# Patient Record
Sex: Male | Born: 1960 | Race: White | Hispanic: No | Marital: Married | Smoking: Never smoker
Health system: Southern US, Community
[De-identification: ages and names within clinical notes are randomized; demographics above are authoritative.]

## PROBLEM LIST (undated history)

## (undated) DIAGNOSIS — G4730 Sleep apnea, unspecified: Secondary | ICD-10-CM

## (undated) DIAGNOSIS — T7840XA Allergy, unspecified, initial encounter: Secondary | ICD-10-CM

## (undated) DIAGNOSIS — K219 Gastro-esophageal reflux disease without esophagitis: Secondary | ICD-10-CM

## (undated) DIAGNOSIS — G5600 Carpal tunnel syndrome, unspecified upper limb: Secondary | ICD-10-CM

## (undated) DIAGNOSIS — N200 Calculus of kidney: Secondary | ICD-10-CM

## (undated) DIAGNOSIS — M1990 Unspecified osteoarthritis, unspecified site: Secondary | ICD-10-CM

## (undated) HISTORY — PX: HERNIA REPAIR: SHX51

## (undated) HISTORY — DX: Allergy, unspecified, initial encounter: T78.40XA

## (undated) HISTORY — PX: HAND TENOLYSIS: SHX978

## (undated) HISTORY — DX: Calculus of kidney: N20.0

## (undated) HISTORY — DX: Unspecified osteoarthritis, unspecified site: M19.90

## (undated) HISTORY — DX: Gastro-esophageal reflux disease without esophagitis: K21.9

## (undated) HISTORY — DX: Sleep apnea, unspecified: G47.30

## (undated) HISTORY — DX: Carpal tunnel syndrome, unspecified upper limb: G56.00

---

## 2006-01-14 ENCOUNTER — Inpatient Hospital Stay (HOSPITAL_COMMUNITY): Admission: AC | Admit: 2006-01-14 | Discharge: 2006-01-19 | Payer: Self-pay

## 2006-01-17 ENCOUNTER — Ambulatory Visit: Payer: Self-pay | Admitting: Physical Medicine & Rehabilitation

## 2007-04-23 IMAGING — CT CT EXTREM UP W/O CM*L*
2 series · 15 of 20 positions shown, 18 images · IV contrast (agent unspecified)
Comparison: none

CLINICAL DATA: Comminuted displaced impacted fracture of the distal left radius.  
 CT OF THE LEFT WRIST WITHOUT CONTRAST:
TECHNIQUE: Multidetector CT imaging was performed according to the standard protocol.  Sagittal and coronal reconstructions were performed for better delineation of the alignment and position of the multiple fracture fragments.

[Series 3: extremitywrist lf 1.0 (id) · axial · 0.48mm/px · z∈[+32,+200]mm · 12 of 201 slices shown, 15 images]
[im 16/201  soft-tissue]
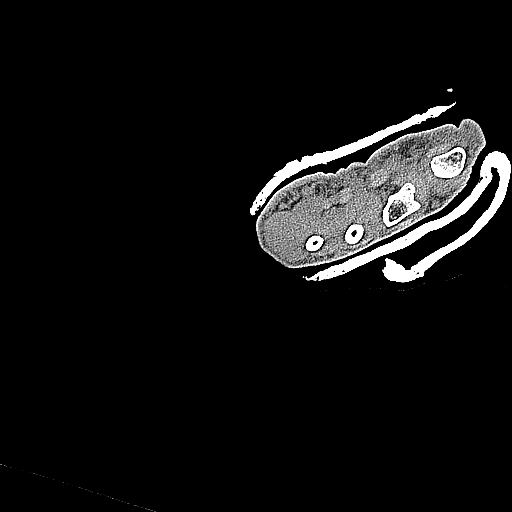
[im 16/201  bone]
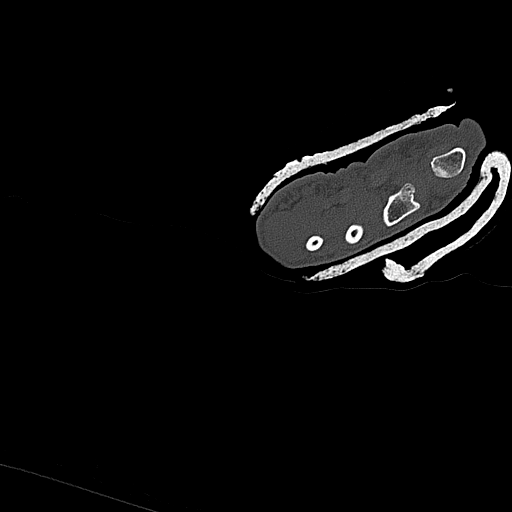
[im 31/201  bone]
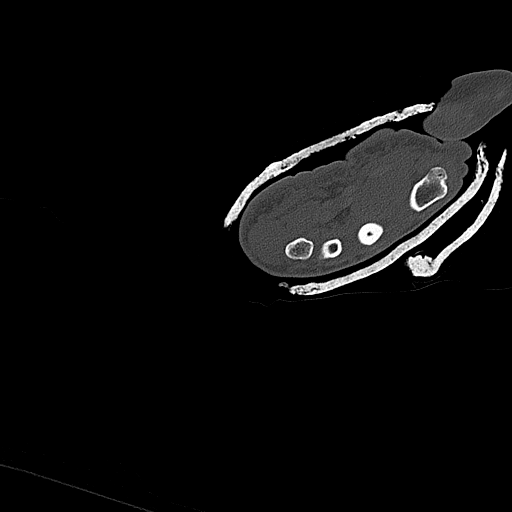
[im 47/201  bone]
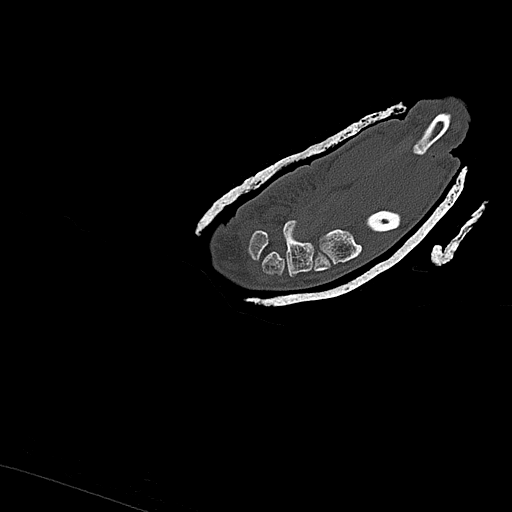
[im 62/201  bone]
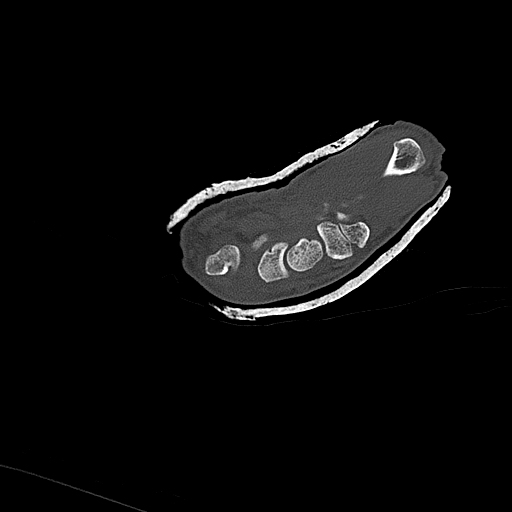
[im 77/201  soft-tissue]
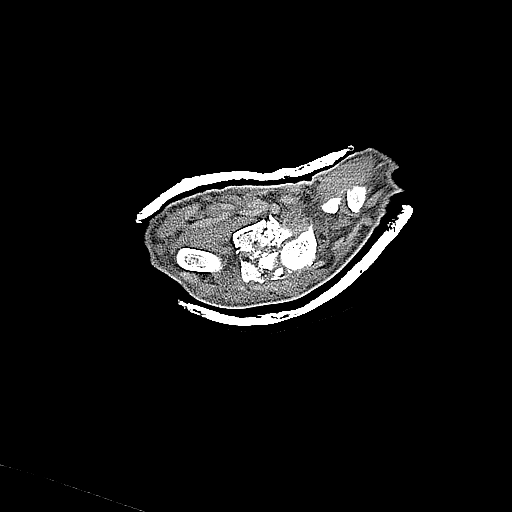
[im 77/201  bone]
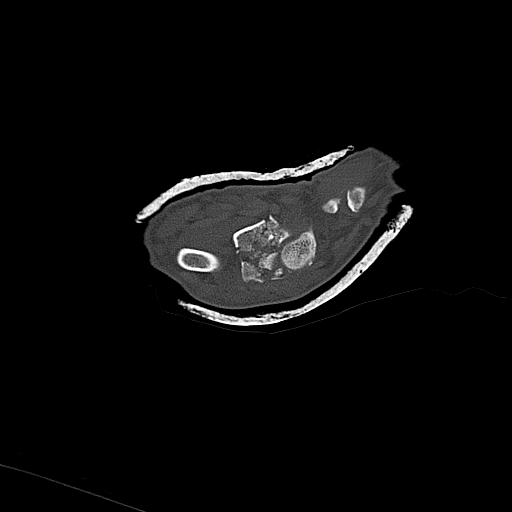
[im 93/201  bone]
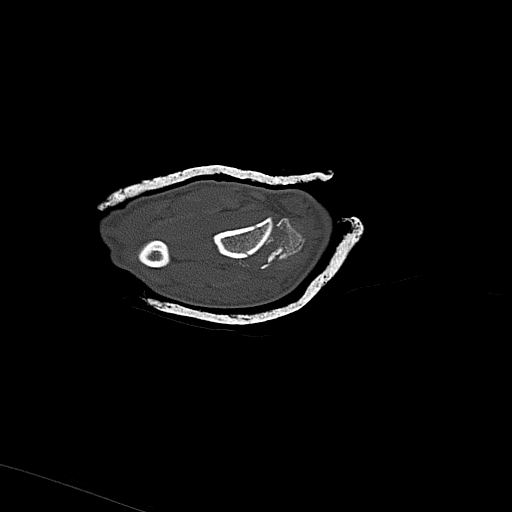
[im 108/201  bone]
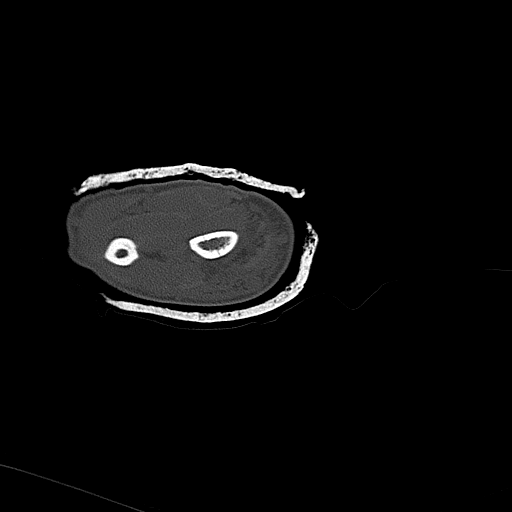
[im 124/201  bone]
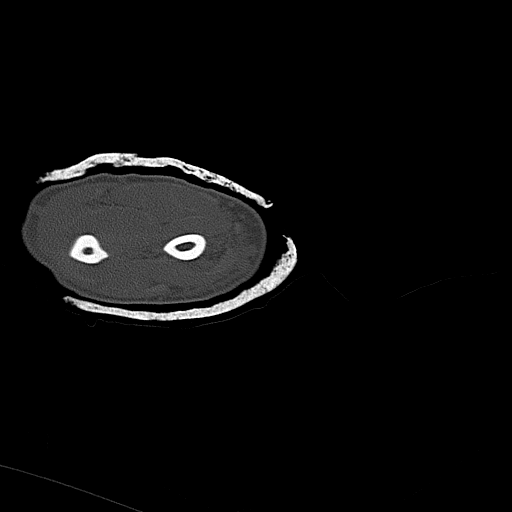
[im 139/201  soft-tissue]
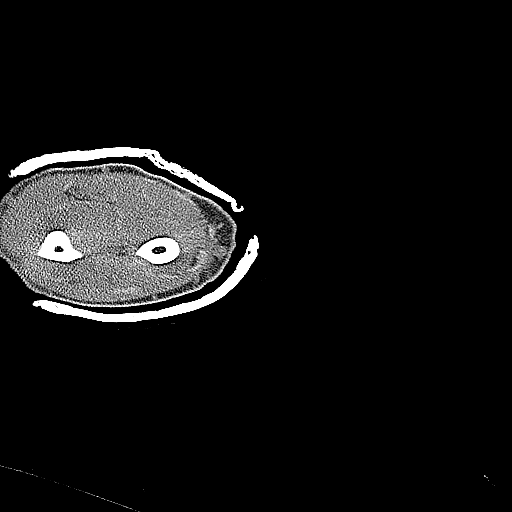
[im 139/201  bone]
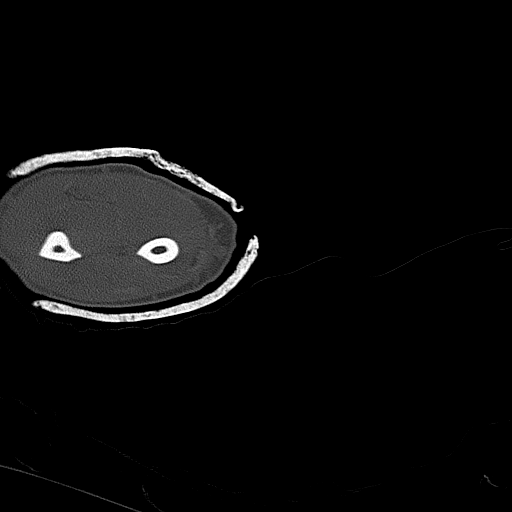
[im 154/201  bone]
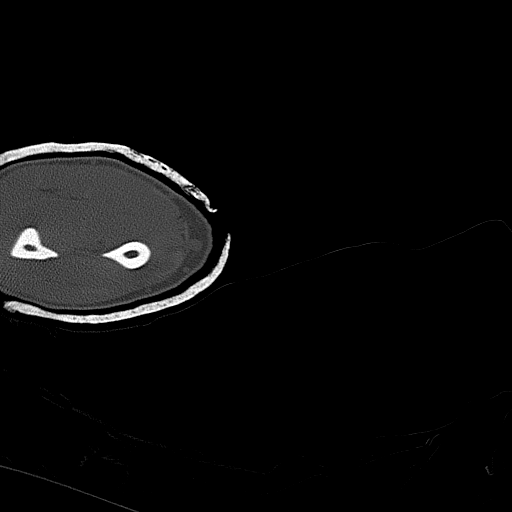
[im 170/201  bone]
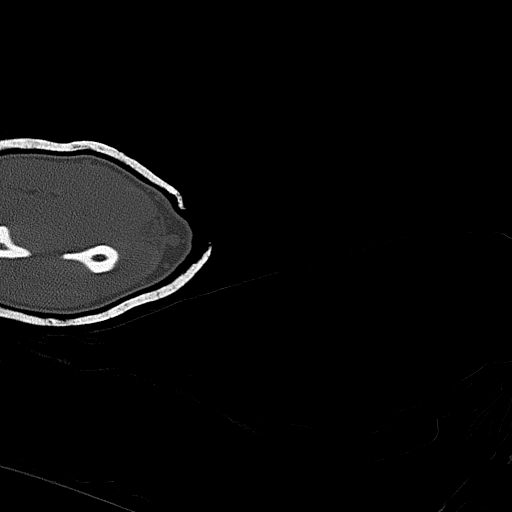
[im 185/201  bone]
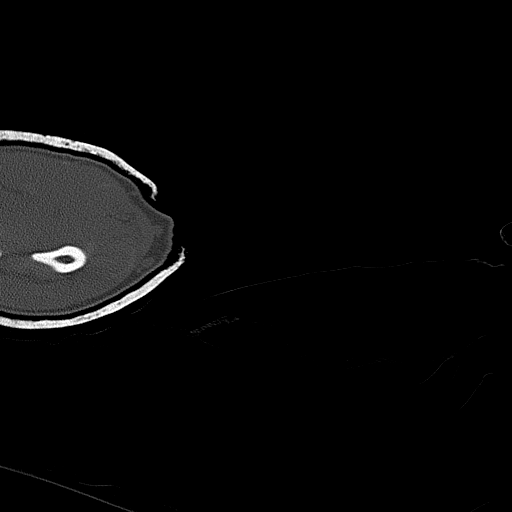

[Series 603: coronal left wrist · coronal · 0.48mm/px · 3 of 20 slices shown]
[im 4/20  bone]
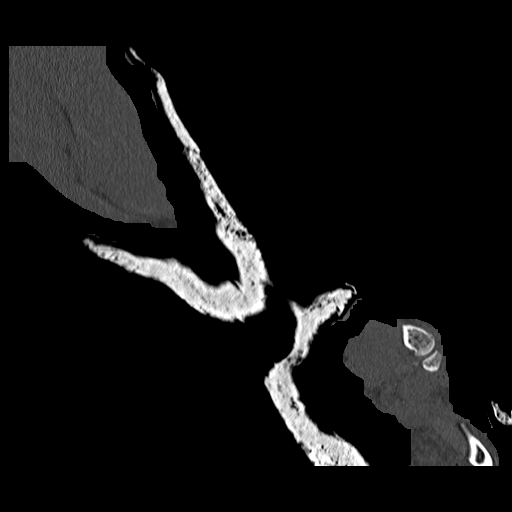
[im 8/20  bone]
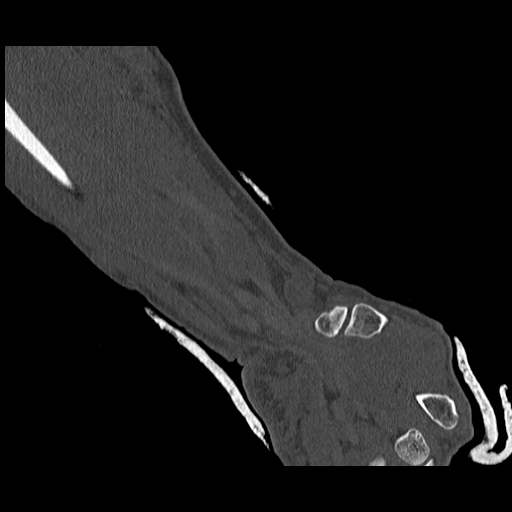
[im 12/20  bone]
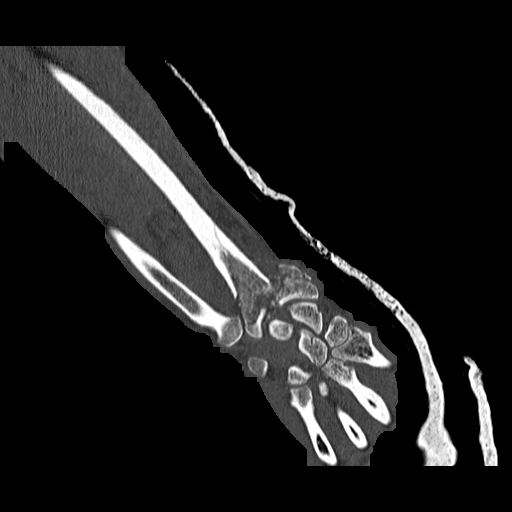

[15 of 20 positions shown; findings below may reference images not displayed]

FINDINGS: There is a severely comminuted impacted dorsally displaced fracture of the distal radius.  There is also a spiral fracture through the metadiaphyseal region of the distal radius but this fracture is nondisplaced.   The distal ulna articulates with fracture fragments and the distal radioulnar joint disease not appear widened.  Carpal bones appear normal in alignment and position.
IMPRESSION: Severely comminuted slightly impacted dorsally displaced fractures of the distal left radius.

## 2007-04-25 IMAGING — CR DG CHEST 1V PORT
1 series · 1 of 1 positions shown · non-contrast
Comparison: Yesterday?s exam.

CLINICAL DATA: Fell off roof.  Fractured ribs and left wrist. 
 PORTABLE CHEST - 1 VIEW, 01/16/06, 4794 HOURS:

[view not recorded]
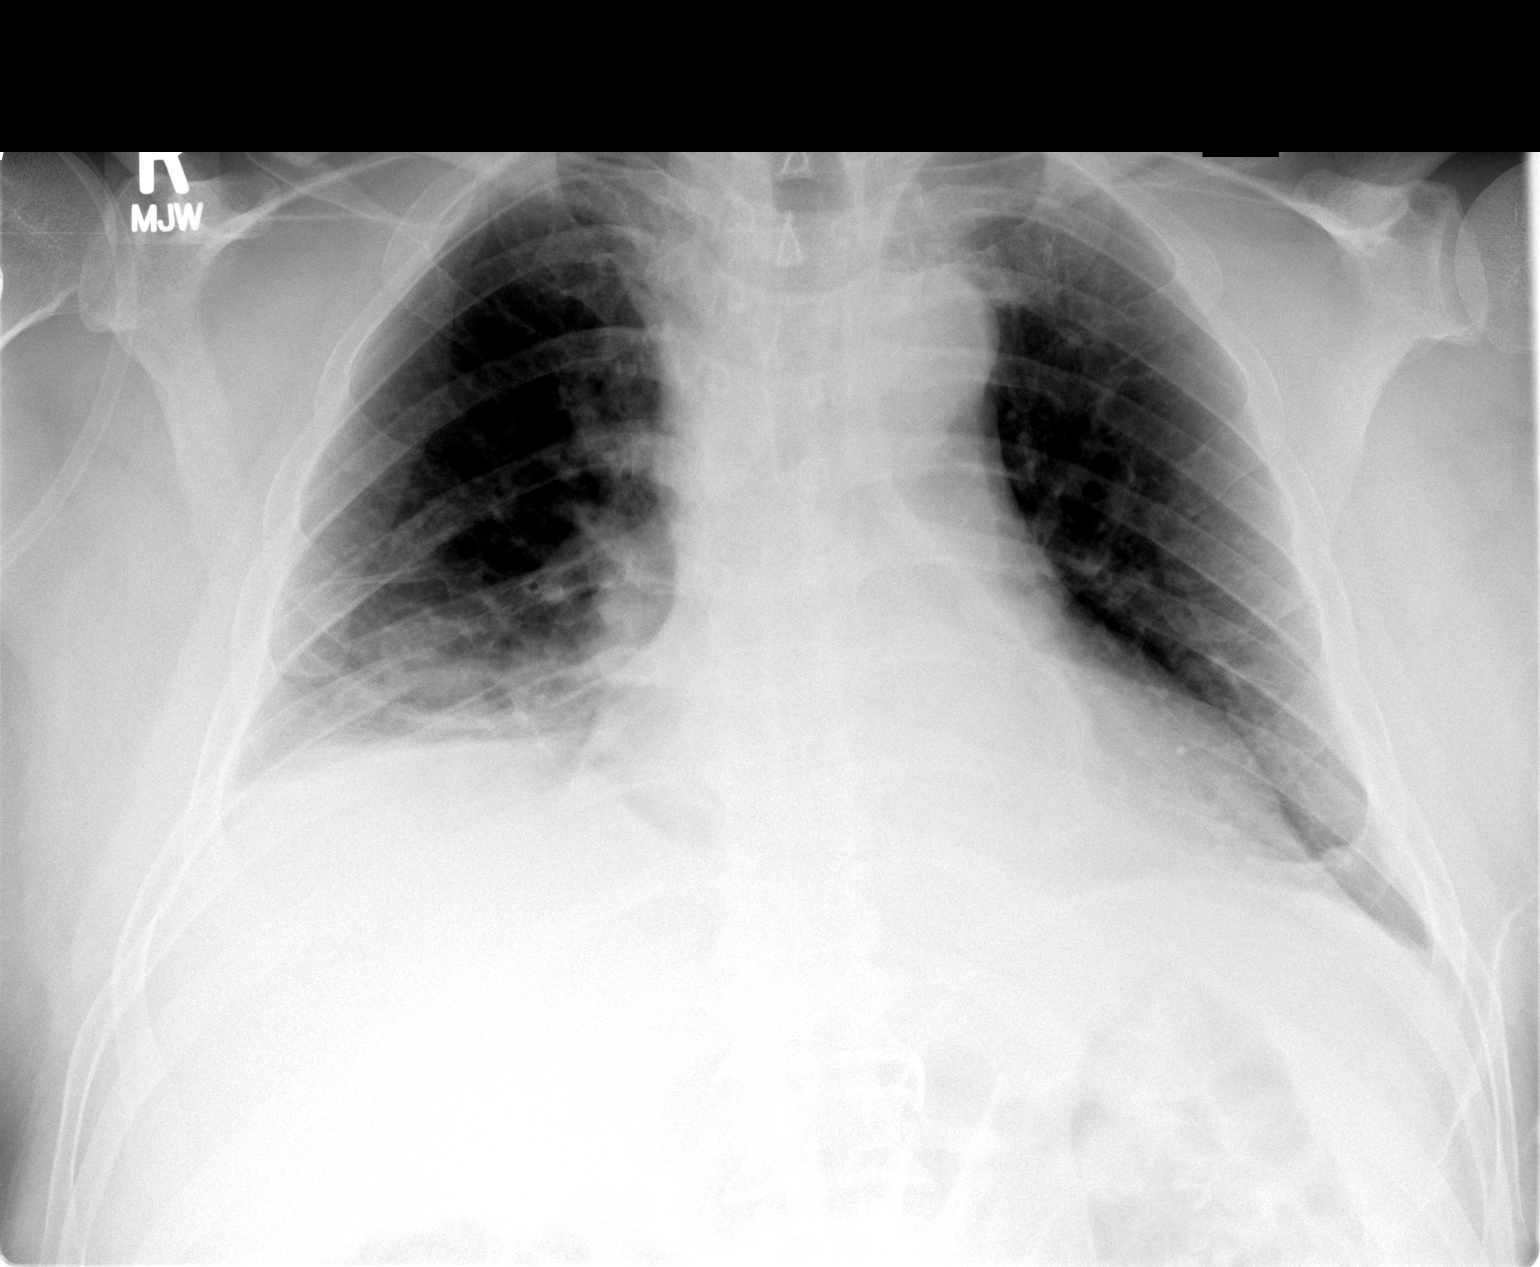

[1 of 1 positions shown; findings below may reference images not displayed]

FINDINGS: Subsegmental atelectasis at the lung bases is again noted.  No pneumothorax.
IMPRESSION: Bibasilar subsegmental atelectasis mainly at the right base again noted.  No pneumothorax.

## 2018-05-09 ENCOUNTER — Encounter: Payer: Self-pay | Admitting: *Deleted

## 2018-05-17 ENCOUNTER — Ambulatory Visit: Payer: Self-pay | Admitting: Family Medicine

## 2018-05-28 ENCOUNTER — Encounter: Payer: Self-pay | Admitting: Family Medicine

## 2018-05-28 ENCOUNTER — Ambulatory Visit: Payer: 59 | Admitting: Family Medicine

## 2018-05-28 VITALS — BP 156/89 | HR 75 | Temp 97.0°F | Ht 72.0 in | Wt 320.8 lb

## 2018-05-28 DIAGNOSIS — Z0000 Encounter for general adult medical examination without abnormal findings: Secondary | ICD-10-CM | POA: Diagnosis not present

## 2018-05-28 LAB — URINALYSIS
BILIRUBIN UA: NEGATIVE
Glucose, UA: NEGATIVE
Ketones, UA: NEGATIVE
Nitrite, UA: NEGATIVE
PH UA: 7 (ref 5.0–7.5)
Protein, UA: NEGATIVE
Specific Gravity, UA: 1.02 (ref 1.005–1.030)
UUROB: 1 mg/dL (ref 0.2–1.0)

## 2018-05-28 LAB — BAYER DCA HB A1C WAIVED: HB A1C: 5.3 % (ref <7.0)

## 2018-05-28 MED ORDER — TAMSULOSIN HCL 0.4 MG PO CAPS: 0.8000 mg | ORAL_CAPSULE | Freq: Every day | ORAL | 3 refills | Status: DC

## 2018-05-28 MED ORDER — SILDENAFIL CITRATE 20 MG PO TABS: 20.0000 mg | ORAL_TABLET | Freq: Every day | ORAL | 5 refills | Status: DC | PRN

## 2018-05-28 NOTE — Progress Notes (Signed)
Subjective:  Patient ID: Jimmy Levine, male    DOB: 02-01-1961  Age: 57 y.o. MRN: [id number]  CC: New Patient (Initial Visit)   HPI Jimmy Levine presents for decreased urine stream. Also notes that he is having to get up multiple times at night to urinate. Additionally he is having erectile dysfunction. Due for CPE.  History Jimmy Levine has a past medical history of Allergy, Arthritis, Carpal tunnel syndrome, GERD (gastroesophageal reflux disease), Kidney stone, and Sleep apnea.   He has a past surgical history that includes Hernia repair and Hand Tenolysis.   His family history includes Arthritis in his brother, father, and mother; COPD in his brother and mother; Heart disease in his father; Hypertension in his mother.He reports that he has never smoked. He does not have any smokeless tobacco history on file. He reports that he drank alcohol. He reports that he does not use drugs.  Current Outpatient Medications on File Prior to Visit  Medication Sig Dispense Refill  . naproxen sodium (ALEVE) 220 MG tablet Take 220 mg by mouth daily as needed.     No current facility-administered medications on file prior to visit.     ROS Review of Systems  Constitutional: Negative for activity change, fatigue and unexpected weight change.  HENT: Negative for congestion, ear pain, hearing loss, postnasal drip and trouble swallowing.   Eyes: Negative for pain and visual disturbance.  Respiratory: Negative for cough, chest tightness and shortness of breath.   Cardiovascular: Negative for chest pain, palpitations and leg swelling.  Gastrointestinal: Negative for abdominal distention, abdominal pain, blood in stool, constipation, diarrhea, nausea and vomiting.  Endocrine: Negative for cold intolerance, heat intolerance and polydipsia.  Genitourinary: Negative for difficulty urinating, dysuria, flank pain, frequency and urgency.  Musculoskeletal: Negative for arthralgias and joint swelling.    Skin: Negative for color change, rash and wound.  Neurological: Negative for dizziness, syncope, speech difficulty, weakness, light-headedness, numbness and headaches.  Hematological: Does not bruise/bleed easily.  Psychiatric/Behavioral: Negative for confusion, decreased concentration, dysphoric mood and sleep disturbance. The patient is not nervous/anxious.     Objective:  BP (!) 156/89   Pulse 75   Temp (!) 97 F (36.1 C)   Ht 6' (1.829 m)   Wt (!) 320 lb 12.8 oz (145.5 kg)   BMI 43.51 kg/m   Physical Exam  Constitutional: He is oriented to person, place, and time. He appears well-developed and well-nourished.  HENT:  Head: Normocephalic and atraumatic.  Mouth/Throat: Oropharynx is clear and moist.  Eyes: Pupils are equal, round, and reactive to light. EOM are normal.  Neck: Normal range of motion. No tracheal deviation present. No thyromegaly present.  Cardiovascular: Normal rate, regular rhythm and normal heart sounds. Exam reveals no gallop and no friction rub.  No murmur heard. Pulmonary/Chest: Breath sounds normal. He has no wheezes. He has no rales.  Abdominal: Soft. Bowel sounds are normal. He exhibits no distension and no mass. There is no tenderness. Hernia confirmed negative in the right inguinal area and confirmed negative in the left inguinal area.  Genitourinary: Testes normal and penis normal.  Musculoskeletal: Normal range of motion. He exhibits no edema.  Lymphadenopathy:    He has no cervical adenopathy.  Neurological: He is alert and oriented to person, place, and time.  Skin: Skin is warm and dry.  Psychiatric: He has a normal mood and affect.    Assessment & Plan:   Jimmy Levine was seen today for new patient (initial visit).  Diagnoses  and all orders for this visit:  Annual physical exam -     CBC with Differential/Platelet -     CMP14+EGFR -     Lipid panel -     PSA, total and free -     Testosterone,Free and Total -     Urinalysis -     Bayer  DCA Hb A1c Waived  Other orders -     sildenafil (REVATIO) 20 MG tablet; Take 1 tablet (20 mg total) by mouth daily as needed (2-5 as needed). -     tamsulosin (FLOMAX) 0.4 MG CAPS capsule; Take 2 capsules (0.8 mg total) by mouth at bedtime. For urine flow and prostate   I am having Jimmy Levine "Jimmy Levine" start on sildenafil and tamsulosin. I am also having him maintain his naproxen sodium.  Meds ordered this encounter  Medications  . sildenafil (REVATIO) 20 MG tablet    Sig: Take 1 tablet (20 mg total) by mouth daily as needed (2-5 as needed).    Dispense:  50 tablet    Refill:  5  . tamsulosin (FLOMAX) 0.4 MG CAPS capsule    Sig: Take 2 capsules (0.8 mg total) by mouth at bedtime. For urine flow and prostate    Dispense:  180 capsule    Refill:  3     Follow-up: Return in about 6 months (around 11/26/2018).  Claretta Fraise, M.D.

## 2018-05-29 LAB — CMP14+EGFR
ALT: 33 IU/L (ref 0–44)
AST: 21 IU/L (ref 0–40)
Albumin/Globulin Ratio: 1.8 (ref 1.2–2.2)
Albumin: 4.2 g/dL (ref 3.5–5.5)
Alkaline Phosphatase: 67 IU/L (ref 39–117)
BUN/Creatinine Ratio: 13 (ref 9–20)
BUN: 12 mg/dL (ref 6–24)
Bilirubin Total: 0.5 mg/dL (ref 0.0–1.2)
CO2: 25 mmol/L (ref 20–29)
Calcium: 9 mg/dL (ref 8.7–10.2)
Chloride: 100 mmol/L (ref 96–106)
Creatinine, Ser: 0.91 mg/dL (ref 0.76–1.27)
GFR calc Af Amer: 107 mL/min/1.73 (ref >59)
GFR calc non Af Amer: 93 mL/min/1.73 (ref >59)
Globulin, Total: 2.4 g/dL (ref 1.5–4.5)
Glucose: 78 mg/dL (ref 65–99)
Potassium: 4.3 mmol/L (ref 3.5–5.2)
Sodium: 143 mmol/L (ref 134–144)
Total Protein: 6.6 g/dL (ref 6.0–8.5)

## 2018-05-29 LAB — LIPID PANEL
Chol/HDL Ratio: 4.3 ratio (ref 0.0–5.0)
Cholesterol, Total: 169 mg/dL (ref 100–199)
HDL: 39 mg/dL — ABNORMAL LOW (ref >39)
LDL Calculated: 104 mg/dL — ABNORMAL HIGH (ref 0–99)
Triglycerides: 128 mg/dL (ref 0–149)
VLDL Cholesterol Cal: 26 mg/dL (ref 5–40)

## 2018-05-29 LAB — CBC WITH DIFFERENTIAL/PLATELET
Basophils Absolute: 0.1 x10E3/uL (ref 0.0–0.2)
Basos: 1 %
EOS (ABSOLUTE): 0.1 x10E3/uL (ref 0.0–0.4)
Eos: 2 %
Hematocrit: 47.9 % (ref 37.5–51.0)
Hemoglobin: 16.2 g/dL (ref 13.0–17.7)
Immature Grans (Abs): 0 x10E3/uL (ref 0.0–0.1)
Immature Granulocytes: 1 %
Lymphocytes Absolute: 1.4 x10E3/uL (ref 0.7–3.1)
Lymphs: 24 %
MCH: 30.6 pg (ref 26.6–33.0)
MCHC: 33.8 g/dL (ref 31.5–35.7)
MCV: 90 fL (ref 79–97)
Monocytes Absolute: 0.8 x10E3/uL (ref 0.1–0.9)
Monocytes: 13 %
Neutrophils Absolute: 3.5 x10E3/uL (ref 1.4–7.0)
Neutrophils: 59 %
Platelets: 216 x10E3/uL (ref 150–450)
RBC: 5.3 x10E6/uL (ref 4.14–5.80)
RDW: 12.7 % (ref 12.3–15.4)
WBC: 5.9 x10E3/uL (ref 3.4–10.8)

## 2018-05-29 LAB — PSA, TOTAL AND FREE
PSA FREE PCT: 28 %
PSA, Free: 0.42 ng/mL
Prostate Specific Ag, Serum: 1.5 ng/mL (ref 0.0–4.0)

## 2018-05-29 LAB — TESTOSTERONE,FREE AND TOTAL
TESTOSTERONE FREE: 11.6 pg/mL (ref 7.2–24.0)
Testosterone: 297 ng/dL (ref 264–916)

## 2018-05-29 MED ORDER — SILDENAFIL CITRATE 20 MG PO TABS: 20.0000 mg | ORAL_TABLET | Freq: Every day | ORAL | 5 refills | Status: DC | PRN

## 2018-05-29 MED ORDER — TAMSULOSIN HCL 0.4 MG PO CAPS: 0.8000 mg | ORAL_CAPSULE | Freq: Every day | ORAL | 3 refills | Status: DC

## 2018-05-29 NOTE — Progress Notes (Signed)
Hello [place],  Your lab result is normal.Some minor variations that are not significant are commonly marked abnormal, but do not represent any medical problem for you.  Best regards, Abdelaziz Westenberger, M.D.

## 2018-05-29 NOTE — Addendum Note (Signed)
Addended byDory Peru: Terik Haughey C on: 05/29/2018 08:46 AM   Modules accepted: Orders

## 2018-06-13 ENCOUNTER — Ambulatory Visit: Payer: 59 | Admitting: Nurse Practitioner

## 2018-06-13 ENCOUNTER — Encounter: Payer: Self-pay | Admitting: Nurse Practitioner

## 2018-06-13 VITALS — BP 162/97 | HR 88 | Temp 97.6°F | Ht 72.0 in | Wt 322.0 lb

## 2018-06-13 DIAGNOSIS — R509 Fever, unspecified: Secondary | ICD-10-CM

## 2018-06-13 DIAGNOSIS — R05 Cough: Secondary | ICD-10-CM

## 2018-06-13 DIAGNOSIS — J40 Bronchitis, not specified as acute or chronic: Secondary | ICD-10-CM | POA: Diagnosis not present

## 2018-06-13 DIAGNOSIS — R059 Cough, unspecified: Secondary | ICD-10-CM

## 2018-06-13 MED ORDER — BENZONATATE 100 MG PO CAPS: 100.0000 mg | ORAL_CAPSULE | Freq: Three times a day (TID) | ORAL | 0 refills | Status: DC | PRN

## 2018-06-13 MED ORDER — BUDESONIDE-FORMOTEROL FUMARATE 80-4.5 MCG/ACT IN AERO: 2.0000 | INHALATION_SPRAY | Freq: Two times a day (BID) | RESPIRATORY_TRACT | 3 refills | Status: DC

## 2018-06-13 MED ORDER — PREDNISONE 20 MG PO TABS
ORAL_TABLET | ORAL | 0 refills | Status: DC
Start: 2018-06-13 — End: 2018-07-19

## 2018-06-13 MED ORDER — LEVALBUTEROL HCL 1.25 MG/3ML IN NEBU
1.2500 mg | INHALATION_SOLUTION | RESPIRATORY_TRACT | Status: AC
  Administered 2018-06-13: 1.25 mg via RESPIRATORY_TRACT

## 2018-06-13 MED ORDER — AZITHROMYCIN 250 MG PO TABS
ORAL_TABLET | ORAL | 0 refills | Status: DC
Start: 2018-06-13 — End: 2018-07-19

## 2018-06-13 NOTE — Progress Notes (Signed)
Subjective:    Patient ID: Jimmy Levine, male    DOB: Jan 23, 1961, 57 y.o.   MRN: [id number]   Chief Complaint: Cough; Nasal Congestion; and Fever   HPI Patient comes in c/o cough, congestion and fever. Started late Monday evening. He has not checked temperature but has fellt hot and had some chills. Has been using delsym OTC for cough- no help.   Review of Systems  Constitutional: Positive for chills and fatigue.  HENT: Positive for congestion, rhinorrhea, sinus pain and sore throat. Negative for ear pain and trouble swallowing.   Respiratory: Positive for cough (productive).   Gastrointestinal: Negative.   Musculoskeletal: Negative.   Neurological: Positive for headaches.  Psychiatric/Behavioral: Negative.   All other systems reviewed and are negative.      Objective:   Physical Exam Vitals signs and nursing note reviewed.  Constitutional:      Appearance: Normal appearance. He is obese. He is ill-appearing.  HENT:     Right Ear: Tympanic membrane, ear canal and external ear normal.     Left Ear: Tympanic membrane, ear canal and external ear normal.     Nose: Nose normal.     Mouth/Throat:     Mouth: Mucous membranes are moist.  Eyes:     Pupils: Pupils are equal, round, and reactive to light.  Neck:     Musculoskeletal: Normal range of motion and neck supple. No muscular tenderness.  Cardiovascular:     Rate and Rhythm: Normal rate and regular rhythm.     Pulses: Normal pulses.     Heart sounds: Normal heart sounds.  Pulmonary:     Breath sounds: Wheezing (tight course exp wheezes) present.  Skin:    General: Skin is warm and dry.  Neurological:     General: No focal deficit present.     Mental Status: He is alert and oriented to person, place, and time.  Psychiatric:        Mood and Affect: Mood normal.        Behavior: Behavior normal.        Thought Content: Thought content normal.   BP (!) 162/97   Pulse 88   Temp 97.6 F (36.4 C) (Oral)   Ht  6' (1.829 m)   Wt (!) 322 lb (146.1 kg)   BMI 43.67 kg/m   S/P xopenex breathing treatment- looser exp wheezes throughout       Assessment & Plan:  Jimmy Levine in today with chief complaint of Cough; Nasal Congestion; and Fever   1. Fever, unspecified fever cause - Veritor Flu A/B Waived  2. Cough  - levalbuterol (XOPENEX) nebulizer solution 1.25 mg  3. Bronchitis 1. Take meds as prescribed 2. Use a cool mist humidifier especially during the winter months and when heat has been humid. 3. Use saline nose sprays frequently 4. Saline irrigations of the nose can be very helpful if done frequently.  * 4X daily for 1 week*  * Use of a nettie pot can be helpful with this. Follow directions with this* 5. Drink plenty of fluids 6. Keep thermostat turn down low 7.For any cough or congestion  Use plain Mucinex- regular strength or max strength is fine   * Children- consult with Pharmacist for dosing 8. For fever or aces or pains- take tylenol or ibuprofen appropriate for age and weight.  * for fevers greater than 101 orally you may alternate ibuprofen and tylenol every  3 hours.    - azithromycin (  ZITHROMAX Z-PAK) 250 MG tablet; As directed  Dispense: 6 tablet; Refill: 0 - predniSONE (DELTASONE) 20 MG tablet; 2 po at sametime daily for 5 days  Dispense: 10 tablet; Refill: 0  Mary-Margaret Daphine Deutscher, FNP

## 2018-06-13 NOTE — Patient Instructions (Signed)

## 2018-06-14 LAB — VERITOR FLU A/B WAIVED
INFLUENZA B: NEGATIVE
Influenza A: NEGATIVE

## 2018-07-19 ENCOUNTER — Ambulatory Visit: Payer: 59 | Admitting: Nurse Practitioner

## 2018-07-19 ENCOUNTER — Ambulatory Visit (INDEPENDENT_AMBULATORY_CARE_PROVIDER_SITE_OTHER): Payer: 59

## 2018-07-19 ENCOUNTER — Encounter: Payer: Self-pay | Admitting: Nurse Practitioner

## 2018-07-19 VITALS — BP 149/97 | HR 81 | Temp 97.7°F | Ht 72.0 in | Wt 319.0 lb

## 2018-07-19 DIAGNOSIS — M1711 Unilateral primary osteoarthritis, right knee: Secondary | ICD-10-CM | POA: Diagnosis not present

## 2018-07-19 DIAGNOSIS — M25561 Pain in right knee: Secondary | ICD-10-CM

## 2018-07-19 MED ORDER — PREDNISONE 10 MG (21) PO TBPK: ORAL_TABLET | ORAL | 0 refills | Status: DC

## 2018-07-19 NOTE — Progress Notes (Signed)
   Subjective:    Patient ID: Jimmy Levine, male    DOB: Apr 22, 1961, 58 y.o.   MRN: [id number]   Chief Complaint:  Right knee pain   HPI Patient comes in today c/o right knee pain. This started 2-3 days ago and swelling started on Tuesday. 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  Rates pain 4-8 . External rotation and flxion at sametime causes the most pain.   Review of Systems  Respiratory: Negative.   Cardiovascular: Negative.   Musculoskeletal: Positive for arthralgias.  All other systems reviewed and are negative.      Objective:   Physical Exam Vitals signs and nursing note reviewed.  Constitutional:      General: He is in acute distress (mild).     Appearance: He is obese.  Cardiovascular:     Rate and Rhythm: Normal rate and regular rhythm.     Pulses: Normal pulses.     Heart sounds: Normal heart sounds.  Musculoskeletal:     Comments: Mild right knee effusion crepiutus on flexion and extension Pain on external rotation and flexion at same time.  Skin:    General: Skin is warm.  Neurological:     General: No focal deficit present.     Mental Status: He is alert and oriented to person, place, and time.  Psychiatric:        Mood and Affect: Mood normal.        Behavior: Behavior normal.    BP (!) 149/97   Pulse 81   Temp 97.7 F (36.5 C) (Oral)   Ht 6' (1.829 m)   Wt (!) 319 lb (144.7 kg)   BMI 43.26 kg/m     Right knee xray- mild osteoarthritis of distal femoral condyle    Assessment & Plan:  Jimmy Levine in today with chief complaint of Knee Pain (right)   1. Acute pain of right knee Elastic wrap   ice bid - DG Knee 1-2 Views Right; Future - predniSONE (STERAPRED UNI-PAK 21 TAB) 10 MG (21) TBPK tablet; As directed x 6 days  Dispense: 21 tablet; Refill: 0  RTO prn  Mary-Margaret Daphine Deutscher, FNP

## 2018-07-19 NOTE — Patient Instructions (Signed)

## 2018-11-29 ENCOUNTER — Encounter: Payer: Self-pay | Admitting: Family Medicine

## 2018-11-29 ENCOUNTER — Ambulatory Visit: Payer: 59 | Admitting: Family Medicine

## 2018-11-29 ENCOUNTER — Other Ambulatory Visit: Payer: Self-pay

## 2018-11-29 NOTE — Progress Notes (Signed)
No answer 3:35, 3:44 Mechele Claude, MD

## 2019-03-26 ENCOUNTER — Encounter: Payer: Self-pay | Admitting: Family

## 2019-03-26 ENCOUNTER — Ambulatory Visit: Payer: 59 | Admitting: Family

## 2019-03-26 ENCOUNTER — Other Ambulatory Visit: Payer: Self-pay

## 2019-03-26 VITALS — BP 165/104 | HR 98 | Temp 98.0°F | Resp 20 | Ht 71.0 in | Wt 320.0 lb

## 2019-03-26 DIAGNOSIS — R0683 Snoring: Secondary | ICD-10-CM

## 2019-03-26 DIAGNOSIS — R5383 Other fatigue: Secondary | ICD-10-CM | POA: Diagnosis not present

## 2019-03-26 DIAGNOSIS — Z0001 Encounter for general adult medical examination with abnormal findings: Secondary | ICD-10-CM | POA: Diagnosis not present

## 2019-03-26 DIAGNOSIS — Z1211 Encounter for screening for malignant neoplasm of colon: Secondary | ICD-10-CM

## 2019-03-26 DIAGNOSIS — Z1212 Encounter for screening for malignant neoplasm of rectum: Secondary | ICD-10-CM

## 2019-03-26 DIAGNOSIS — M25561 Pain in right knee: Secondary | ICD-10-CM

## 2019-03-26 DIAGNOSIS — Z1159 Encounter for screening for other viral diseases: Secondary | ICD-10-CM

## 2019-03-26 DIAGNOSIS — I10 Essential (primary) hypertension: Secondary | ICD-10-CM | POA: Diagnosis not present

## 2019-03-26 DIAGNOSIS — Z0000 Encounter for general adult medical examination without abnormal findings: Secondary | ICD-10-CM

## 2019-03-26 DIAGNOSIS — Z8249 Family history of ischemic heart disease and other diseases of the circulatory system: Secondary | ICD-10-CM

## 2019-03-26 DIAGNOSIS — Z114 Encounter for screening for human immunodeficiency virus [HIV]: Secondary | ICD-10-CM

## 2019-03-26 MED ORDER — LISINOPRIL-HYDROCHLOROTHIAZIDE 20-12.5 MG PO TABS: 1.0000 | ORAL_TABLET | Freq: Every day | ORAL | 3 refills | Status: DC

## 2019-03-26 MED ORDER — PREDNISONE 10 MG (21) PO TBPK: ORAL_TABLET | ORAL | 0 refills | Status: DC

## 2019-03-26 MED ORDER — DICLOFENAC SODIUM 75 MG PO TBEC: 75.0000 mg | DELAYED_RELEASE_TABLET | Freq: Two times a day (BID) | ORAL | 0 refills | Status: DC

## 2019-03-26 NOTE — Progress Notes (Signed)
Subjective:    Patient ID: Jimmy Levine, male    DOB: 05/12/1961, 58 y.o.   MRN: [id number]  Chief Complaint  Patient presents with  . Knee Pain   Pt presents to the office today for CPE and  right knee pain that started several weeks ago. He reports the pain is significant.  He does report fatigue that started over a year ago, but has worsen. He is morbid obese and does admit to snoring. He has lab work on 05/2018 that was stable. He does report his father had a stent placed when he was around 59  Year old.   Knee Pain  The incident occurred more than 1 week ago. There was no injury mechanism. The pain is present in the right knee. The pain is at a severity of 9/10. The pain is moderate. The pain has been constant since onset. Associated symptoms include a loss of motion. Pertinent negatives include no numbness or tingling. The symptoms are aggravated by movement and weight bearing. He has tried rest and acetaminophen for the symptoms. The treatment provided mild relief.  Hypertension This is a new problem. The current episode started more than 1 year ago. The problem has been waxing and waning since onset. The problem is uncontrolled. Associated symptoms include peripheral edema ("at the end of the day"). Pertinent negatives include no headaches, malaise/fatigue or shortness of breath. Risk factors for coronary artery disease include obesity. Past treatments include nothing. The current treatment provides no improvement. There is no history of kidney disease, CAD/MI, CVA or heart failure.      Review of Systems  Constitutional: Negative for malaise/fatigue.  Respiratory: Negative for shortness of breath.   Neurological: Negative for tingling, numbness and headaches.  All other systems reviewed and are negative.  Family History  Problem Relation Age of Onset  . Hypertension Mother   . COPD Mother   . Arthritis Mother   . Heart disease Father   . Arthritis Father   . COPD  Brother   . Arthritis Brother     Social History   Socioeconomic History  . Marital status: Married    Spouse name: Not on file  . Number of children: Not on file  . Years of education: Not on file  . Highest education level: Not on file  Occupational History  . Occupation: builder/welder  Social Needs  . Financial resource strain: Not on file  . Food insecurity    Worry: Not on file    Inability: Not on file  . Transportation needs    Medical: Not on file    Non-medical: Not on file  Tobacco Use  . Smoking status: Never Smoker  . Smokeless tobacco: Never Used  Substance and Sexual Activity  . Alcohol use: Not Currently    Frequency: Never    Comment: 10 yrs ago  . Drug use: Never  . Sexual activity: Yes    Birth control/protection: None  Lifestyle  . Physical activity    Days per week: Not on file    Minutes per session: Not on file  . Stress: Only a little  Relationships  . Social Herbalist on phone: Not on file    Gets together: Not on file    Attends religious service: Not on file    Active member of club or organization: Not on file    Attends meetings of clubs or organizations: Not on file    Relationship status: Not on  file  Other Topics Concern  . Not on file  Social History Narrative  . Not on file       Objective:   Physical Exam Vitals signs reviewed.  Constitutional:      General: He is not in acute distress.    Appearance: He is well-developed. He is obese.  HENT:     Head: Normocephalic.     Right Ear: Tympanic membrane normal.     Left Ear: Tympanic membrane normal.  Eyes:     General:        Right eye: No discharge.        Left eye: No discharge.     Pupils: Pupils are equal, round, and reactive to light.  Neck:     Musculoskeletal: Normal range of motion and neck supple.     Thyroid: No thyromegaly.  Cardiovascular:     Rate and Rhythm: Normal rate and regular rhythm.     Heart sounds: Normal heart sounds. No murmur.   Pulmonary:     Effort: Pulmonary effort is normal. No respiratory distress.     Breath sounds: Normal breath sounds. No wheezing.  Abdominal:     General: Bowel sounds are normal. There is no distension.     Palpations: Abdomen is soft.     Tenderness: There is no abdominal tenderness.  Musculoskeletal: Normal range of motion.        General: Tenderness present.     Comments: Pain in right medial knee with flexion and extension  Skin:    General: Skin is warm and dry.     Findings: No erythema or rash.  Neurological:     Mental Status: He is alert and oriented to person, place, and time.     Cranial Nerves: No cranial nerve deficit.     Deep Tendon Reflexes: Reflexes are normal and symmetric.  Psychiatric:        Behavior: Behavior normal.        Thought Content: Thought content normal.        Judgment: Judgment normal.     BP (!) 165/104   Pulse 98   Temp 98 F (36.7 C) (Temporal)   Resp 20   Ht '5\' 11"'$  (1.803 m)   Wt (!) 320 lb (145.2 kg)   SpO2 94%   BMI 44.63 kg/m      Assessment & Plan:  Jimmy Levine comes in today with chief complaint of Knee Pain   Diagnosis and orders addressed:  1. Acute pain of right knee - diclofenac (VOLTAREN) 75 MG EC tablet; Take 1 tablet (75 mg total) by mouth 2 (two) times daily.  Dispense: 30 tablet; Refill: 0 - Ambulatory referral to Orthopedic Surgery - CMP14+EGFR - CBC with Differential/Platelet - predniSONE (STERAPRED UNI-PAK 21 TAB) 10 MG (21) TBPK tablet; Use as directed  Dispense: 21 tablet; Refill: 0  2. Morbid obesity ([place]) - Ambulatory referral to Pulmonology - Ambulatory referral to Cardiology - CMP14+EGFR - CBC with Differential/Platelet - EKG 12-Lead  3. Essential hypertension - Ambulatory referral to Cardiology - lisinopril-hydrochlorothiazide (ZESTORETIC) 20-12.5 MG tablet; Take 1 tablet by mouth daily.  Dispense: 90 tablet; Refill: 3 - CMP14+EGFR - CBC with Differential/Platelet  4. Fatigue,  unspecified type - Ambulatory referral to Pulmonology - Ambulatory referral to Cardiology - CMP14+EGFR - CBC with Differential/Platelet - EKG 12-Lead  5. Snoring - Ambulatory referral to Pulmonology - CMP14+EGFR - CBC with Differential/Platelet  6. Family history of cardiovascular disease - Ambulatory referral to Cardiology -  CMP14+EGFR - CBC with Differential/Platelet  7. Colon cancer screening - Cologuard - CMP14+EGFR - CBC with Differential/Platelet  8. Screening for malignant neoplasm of the rectum - Cologuard - CMP14+EGFR - CBC with Differential/Platelet  9. Annual physical exam - CMP14+EGFR - CBC with Differential/Platelet - Lipid panel - TSH - PSA, total and free - Hepatitis C antibody - HIV Antibody (routine testing w rflx)  10. Need for hepatitis C screening test - CMP14+EGFR - CBC with Differential/Platelet - Hepatitis C antibody  11. Encounter for screening for HIV - CMP14+EGFR - CBC with Differential/Platelet - HIV Antibody (routine testing w rflx)   Labs pending Health Maintenance reviewed Diet and exercise encouraged  Follow up plan: 2 weeks to recheck HTN   Evelina Dun, FNP

## 2019-03-26 NOTE — Patient Instructions (Signed)
Acute Knee Pain, Adult °Acute knee pain is sudden and may be caused by damage, swelling, or irritation of the muscles and tissues that support your knee. The injury may result from: °· A fall. °· An injury to your knee from twisting motions. °· A hit to the knee. °· Infection. °Acute knee pain may go away on its own with time and rest. If it does not, your health care provider may order tests to find the cause of the pain. These may include: °· Imaging tests, such as an X-ray, MRI, or ultrasound. °· Joint aspiration. In this test, fluid is removed from the knee. °· Arthroscopy. In this test, a lighted tube is inserted into the knee and an image is projected onto a TV screen. °· Biopsy. In this test, a sample of tissue is removed from the body and studied under a microscope. °Follow these instructions at home: °Pay attention to any changes in your symptoms. Take these actions to relieve your pain. °If you have a knee sleeve or brace: ° °· Wear the sleeve or brace as told by your health care provider. Remove it only as told by your health care provider. °· Loosen the sleeve or brace if your toes tingle, become numb, or turn cold and blue. °· Keep the sleeve or brace clean. °· If the sleeve or brace is not waterproof: °? Do not let it get wet. °? Cover it with a watertight covering when you take a bath or shower. °Activity °· Rest your knee. °· Do not do things that cause pain or make pain worse. °· Avoid high-impact activities or exercises, such as running, jumping rope, or doing jumping jacks. °· Work with a physical therapist to make a safe exercise program, as recommended by your health care provider. Do exercises as told by your physical therapist. °Managing pain, stiffness, and swelling ° °· If directed, put ice on the knee: °? Put ice in a plastic bag. °? Place a towel between your skin and the bag. °? Leave the ice on for 20 minutes, 2-3 times a day. °· If directed, use an elastic bandage to put pressure  (compression) on your injured knee. This may control swelling, give support, and help with discomfort. °General instructions °· Take over-the-counter and prescription medicines only as told by your health care provider. °· Raise (elevate) your knee above the level of your heart when you are sitting or lying down. °· Sleep with a pillow under your knee. °· Do not use any products that contain nicotine or tobacco, such as cigarettes, e-cigarettes, and chewing tobacco. These can delay healing. If you need help quitting, ask your health care provider. °· If you are overweight, work with your health care provider and a dietitian to set a weight-loss goal that is healthy and reasonable for you. Extra weight can put pressure on your knee. °· Keep all follow-up visits as told by your health care provider. This is important. °Contact a health care provider if: °· Your knee pain continues, changes, or gets worse. °· You have a fever along with knee pain. °· Your knee feels warm to the touch. °· Your knee buckles or locks up. °Get help right away if: °· Your knee swells, and the swelling becomes worse. °· You cannot move your knee. °· You have severe pain in your knee. °Summary °· Acute knee pain can be caused by a fall, an injury, an infection, or damage, swelling, or irritation of the tissues that support your knee. °·   Your health care provider may perform tests to find out the cause of the pain. °· Pay attention to any changes in your symptoms. Relieve your pain with rest, medicines, light activity, and use of ice. °· Get help if your pain continues or becomes worse, your knee swells, or you cannot move your knee. °This information is not intended to replace advice given to you by your health care provider. Make sure you discuss any questions you have with your health care provider. °Document Released: 04/16/2007 Document Revised: 11/29/2017 Document Reviewed: 11/29/2017 °[employer]vier Patient Education © 2020 [employer] ° °

## 2019-03-27 ENCOUNTER — Other Ambulatory Visit: Payer: Self-pay | Admitting: Family

## 2019-03-27 LAB — CBC WITH DIFFERENTIAL/PLATELET
Basophils Absolute: 0.1 10*3/uL (ref 0.0–0.2)
Basos: 1 %
EOS (ABSOLUTE): 0.2 10*3/uL (ref 0.0–0.4)
Eos: 2 %
Hematocrit: 46.3 % (ref 37.5–51.0)
Hemoglobin: 15.4 g/dL (ref 13.0–17.7)
Immature Grans (Abs): 0 10*3/uL (ref 0.0–0.1)
Immature Granulocytes: 0 %
Lymphocytes Absolute: 1.5 10*3/uL (ref 0.7–3.1)
Lymphs: 21 %
MCH: 30.7 pg (ref 26.6–33.0)
MCHC: 33.3 g/dL (ref 31.5–35.7)
MCV: 92 fL (ref 79–97)
Monocytes Absolute: 0.7 10*3/uL (ref 0.1–0.9)
Monocytes: 10 %
Neutrophils Absolute: 4.6 10*3/uL (ref 1.4–7.0)
Neutrophils: 66 %
Platelets: 235 10*3/uL (ref 150–450)
RBC: 5.02 x10E6/uL (ref 4.14–5.80)
RDW: 12.9 % (ref 11.6–15.4)
WBC: 7 10*3/uL (ref 3.4–10.8)

## 2019-03-27 LAB — CMP14+EGFR
ALT: 33 IU/L (ref 0–44)
AST: 27 IU/L (ref 0–40)
Albumin/Globulin Ratio: 1.8 (ref 1.2–2.2)
Albumin: 4.2 g/dL (ref 3.8–4.9)
Alkaline Phosphatase: 69 IU/L (ref 39–117)
BUN/Creatinine Ratio: 15 (ref 9–20)
BUN: 14 mg/dL (ref 6–24)
Bilirubin Total: 0.3 mg/dL (ref 0.0–1.2)
CO2: 25 mmol/L (ref 20–29)
Calcium: 9.3 mg/dL (ref 8.7–10.2)
Chloride: 103 mmol/L (ref 96–106)
Creatinine, Ser: 0.96 mg/dL (ref 0.76–1.27)
GFR calc Af Amer: 100 mL/min/{1.73_m2} (ref >59)
GFR calc non Af Amer: 86 mL/min/{1.73_m2} (ref >59)
Globulin, Total: 2.3 g/dL (ref 1.5–4.5)
Glucose: 103 mg/dL — ABNORMAL HIGH (ref 65–99)
Potassium: 4.4 mmol/L (ref 3.5–5.2)
Sodium: 141 mmol/L (ref 134–144)
Total Protein: 6.5 g/dL (ref 6.0–8.5)

## 2019-03-27 LAB — LIPID PANEL
Chol/HDL Ratio: 4.5 ratio (ref 0.0–5.0)
Cholesterol, Total: 170 mg/dL (ref 100–199)
HDL: 38 mg/dL — ABNORMAL LOW (ref >39)
LDL Chol Calc (NIH): 97 mg/dL (ref 0–99)
Triglycerides: 205 mg/dL — ABNORMAL HIGH (ref 0–149)
VLDL Cholesterol Cal: 35 mg/dL (ref 5–40)

## 2019-03-27 LAB — HIV ANTIBODY (ROUTINE TESTING W REFLEX): HIV Screen 4th Generation wRfx: NONREACTIVE

## 2019-03-27 LAB — TSH: TSH: 2.01 u[IU]/mL (ref 0.450–4.500)

## 2019-03-27 LAB — PSA, TOTAL AND FREE
PSA, Free Pct: 27.3 %
PSA, Free: 0.6 ng/mL
Prostate Specific Ag, Serum: 2.2 ng/mL (ref 0.0–4.0)

## 2019-03-27 LAB — HEPATITIS C ANTIBODY: Hep C Virus Ab: <0.1 s/co ratio (ref 0.0–0.9)

## 2019-03-27 MED ORDER — ATORVASTATIN CALCIUM 20 MG PO TABS: 20.0000 mg | ORAL_TABLET | Freq: Every day | ORAL | 3 refills | Status: DC

## 2019-04-02 ENCOUNTER — Ambulatory Visit: Payer: 59 | Admitting: Cardiovascular Disease

## 2019-04-02 ENCOUNTER — Other Ambulatory Visit: Payer: Self-pay

## 2019-04-02 ENCOUNTER — Telehealth: Payer: Self-pay | Admitting: Cardiovascular Disease

## 2019-04-02 ENCOUNTER — Encounter: Payer: Self-pay | Admitting: *Deleted

## 2019-04-02 ENCOUNTER — Encounter: Payer: Self-pay | Admitting: Cardiovascular Disease

## 2019-04-02 VITALS — BP 147/93 | HR 73 | Ht 72.0 in | Wt 320.4 lb

## 2019-04-02 DIAGNOSIS — R0609 Other forms of dyspnea: Secondary | ICD-10-CM | POA: Diagnosis not present

## 2019-04-02 DIAGNOSIS — R5383 Other fatigue: Secondary | ICD-10-CM

## 2019-04-02 DIAGNOSIS — I10 Essential (primary) hypertension: Secondary | ICD-10-CM

## 2019-04-02 NOTE — Progress Notes (Signed)
CARDIOLOGY CONSULT NOTE  Patient ID: Jimmy Levine MRN: [id number] DOB/AGE: 1961-07-02 58 y.o.  Admit date: (Not on file) Primary Physician: Mechele ClaudeStacks, Warren, MD Referring Physician: Junie SpencerHawks, Christy A, FNP.    Reason for Consultation: Morbid obesity, hypertension, fatigue  HPI: Jimmy Levine is a 58 y.o. male who is being seen today for the evaluation of morbid obesity, hypertension and fatigue at the request of Junie SpencerHawks, Christy A, FNP.   I personally reviewed ECG performed on 03/26/2019 which demonstrated sinus rhythm with no ischemic ST segment or T wave abnormalities, nor any arrhythmias.  He has noticed progressive fatigue and some exertional dyspnea over the past 2 years.  He used to be a Public relations account executivelifeguard and used to swim but has noticed a decline in his energy levels.  He denies exertional chest pain.  He has significant bilateral leg cramps and left hand cramps at times.  He said he does not sleep well and sleeps about 4 hours per night.  He wonders if he has low testosterone levels.  About 9 months ago he developed significant right leg cramps and then his whole right leg was swollen.  He was never evaluated by a physician for this.  There is no family history of DVT/PE.   Family history: Father underwent coronary artery stent placement at the age of 59.  Social history: He is a non-smoker.  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  [place] recently moved [place] into his father's house.  His father passed away in 2018.  The patient helped build the [employer].   Allergies  Allergen Reactions  . Morphine And Related     Hot     Current Outpatient Medications  Medication Sig Dispense Refill  . diclofenac (VOLTAREN) 75 MG EC tablet Take 1 tablet (75 mg total) by mouth 2 (two) times daily. 30 tablet 0  . lisinopril-hydrochlorothiazide (ZESTORETIC) 20-12.5 MG tablet Take 1 tablet by mouth daily. 90  tablet 3  . sildenafil (REVATIO) 20 MG tablet Take 1 tablet (20 mg total) by mouth daily as needed (2-5 as needed). 50 tablet 5  . atorvastatin (LIPITOR) 20 MG tablet Take 1 tablet (20 mg total) by mouth daily. (Patient not taking: Reported on 04/02/2019) 90 tablet 3   No current facility-administered medications for this visit.     Past Medical History:  Diagnosis Date  . Allergy   . Arthritis   . Carpal tunnel syndrome   . GERD (gastroesophageal reflux disease)   . Kidney stone   . Sleep apnea    unsure    Past Surgical History:  Procedure Laterality Date  . HAND TENOLYSIS    . HERNIA REPAIR      Social History   Socioeconomic History  . Marital status: Married    Spouse name: Not on file  . Number of children: Not on file  . Years of education: Not on file  . Highest education level: Not on file  Occupational History  . Occupation: builder/welder  Social Needs  . Financial resource strain: Not on file  . Food insecurity    Worry: Not on file    Inability: Not on file  . Transportation needs    Medical: Not on file    Non-medical: Not on file  Tobacco Use  . Smoking status: Never Smoker  . Smokeless tobacco: Never Used  Substance and Sexual Activity  . Alcohol  use: Not Currently    Frequency: Never    Comment: 10 yrs ago  . Drug use: Never  . Sexual activity: Yes    Birth control/protection: None  Lifestyle  . Physical activity    Days per week: Not on file    Minutes per session: Not on file  . Stress: Only a little  Relationships  . Social Musician on phone: Not on file    Gets together: Not on file    Attends religious service: Not on file    Active member of club or organization: Not on file    Attends meetings of clubs or organizations: Not on file    Relationship status: Not on file  . Intimate partner violence    Fear of current or ex partner: Not on file    Emotionally abused: Not on file    Physically abused: Not on file     Forced sexual activity: Not on file  Other Topics Concern  . Not on file  Social History Narrative  . Not on file      Current Meds  Medication Sig  . diclofenac (VOLTAREN) 75 MG EC tablet Take 1 tablet (75 mg total) by mouth 2 (two) times daily.  Marland Kitchen lisinopril-hydrochlorothiazide (ZESTORETIC) 20-12.5 MG tablet Take 1 tablet by mouth daily.  . sildenafil (REVATIO) 20 MG tablet Take 1 tablet (20 mg total) by mouth daily as needed (2-5 as needed).      Review of systems complete and found to be negative unless listed above in HPI    Physical exam Blood pressure (!) 147/93, pulse 73, height 6' (1.829 m), weight (!) 320 lb 6.4 oz (145.3 kg), SpO2 95 %. General: NAD Neck: No JVD, no thyromegaly or thyroid nodule.  Lungs: Clear to auscultation bilaterally with normal respiratory effort. CV: Nondisplaced PMI. Regular rate and rhythm, normal S1/S2, no S3/S4, no murmur.  No peripheral edema.  No carotid bruit.    Abdomen: Soft, nontender, no distention.  Skin: Intact without lesions or rashes.  Neurologic: Alert and oriented x 3.  Psych: Normal affect. Extremities: No clubbing or cyanosis.  HEENT: Normal.   ECG: Most recent ECG reviewed.   Labs: Lab Results  Component Value Date/Time   K 4.4 03/26/2019 04:38 PM   BUN 14 03/26/2019 04:38 PM   CREATININE 0.96 03/26/2019 04:38 PM   ALT 33 03/26/2019 04:38 PM   TSH 2.010 03/26/2019 04:38 PM   HGB 15.4 03/26/2019 04:38 PM     Lipids: Lab Results  Component Value Date/Time   LDLCALC 97 03/26/2019 04:38 PM   CHOL 170 03/26/2019 04:38 PM   TRIG 205 (H) 03/26/2019 04:38 PM   HDL 38 (L) 03/26/2019 04:38 PM        ASSESSMENT AND PLAN:  1.  Fatigue/dyspnea on exertion: Unclear etiology.  He has been referred to pulmonology.  He has a high likelihood for the presence of obstructive sleep apnea.  ECG is normal.  CBC, comprehensive metabolic panel, and TSH are normal.  There is a family history of premature coronary artery disease  although his father was a smoker and the patient has not.  By history, it sounds like he may have had a DVT in his right leg about 9 months ago. I will order a 2-D echocardiogram with Doppler to evaluate cardiac structure, function, and regional wall motion. I will proceed with a nuclear myocardial perfusion imaging study to evaluate for ischemic heart disease (Lexiscan Myoview).  2.  Hypertension: Blood pressure is normal.  No changes to therapy.  3.  Morbid obesity: He needs significant weight loss.     Disposition: Follow up in 3 months  Signed: Kate Sable, M.D., F.A.C.C.  04/02/2019, 8:44 AM

## 2019-04-02 NOTE — Addendum Note (Signed)
Addended by: Laurine Blazer on: 04/02/2019 09:07 AM   Modules accepted: Orders

## 2019-04-02 NOTE — Telephone Encounter (Signed)
Pre-cert Verification for the following procedure    Lexiscan Myoview scheduled for 04-10-2019 at [facility]   Echo scheduled for 04-17-2019 at [facility]

## 2019-04-02 NOTE — Patient Instructions (Signed)
Medication Instructions:  Continue all current medications.  Labwork: none  Testing/Procedures:  Your physician has requested that you have an echocardiogram. Echocardiography is a painless test that uses sound waves to create images of your heart. It provides your doctor with information about the size and shape of your heart and how well your heart's chambers and valves are working. This procedure takes approximately one hour. There are no restrictions for this procedure.  Your physician has requested that you have a lexiscan myoview. For further information please visit www.[email or web address]. Please follow instruction sheet, as given.  Office will contact with results via phone or letter.    Follow-Up: 3 months   Any Other Special Instructions Will Be Listed Below (If Applicable).  If you need a refill on your cardiac medications before your next appointment, please call your pharmacy.  

## 2019-04-08 ENCOUNTER — Telehealth: Payer: Self-pay | Admitting: Cardiovascular Disease

## 2019-04-08 ENCOUNTER — Telehealth: Payer: Self-pay | Admitting: Family Medicine

## 2019-04-08 DIAGNOSIS — R5383 Other fatigue: Secondary | ICD-10-CM

## 2019-04-08 DIAGNOSIS — R0609 Other forms of dyspnea: Secondary | ICD-10-CM

## 2019-04-08 NOTE — Telephone Encounter (Signed)
Continue taking the medication until we know what his BP is. Lipitor usually causes muscle pain not fatigue. Keep your appt for the stress test.

## 2019-04-08 NOTE — Telephone Encounter (Signed)
Per message from Ciro Backer (clinic pt financial couns/precert) -- we had to cancel pt's lexiscan for due to the following :  Bouse was denied by [facility] at 8pm last night   I submitted the urgent appeal but I messaged Dr. Bronson Ing about what hed like to do   they are denying it because they want him to have a GXT instead

## 2019-04-08 NOTE — Telephone Encounter (Signed)
Patient aware and verbalizes understanding. 

## 2019-04-08 NOTE — Telephone Encounter (Signed)
Please make him an appt to be seen. We need to recheck his BP. Has he checked this at home?

## 2019-04-08 NOTE — Telephone Encounter (Signed)
Returned patient's call - Patient was placed on atorvastatin and lisinopril-hctz 9/24.  States for the last 3-4 days he has been feeling fatigue.  Today patient has been nauseous all day. He has an appointment for a stress test Thursday and would like to know what he should do?  Patient states that he will look for his bp machine and when we call him back he will try to have a bp reading.   Please advise

## 2019-04-09 ENCOUNTER — Ambulatory Visit (INDEPENDENT_AMBULATORY_CARE_PROVIDER_SITE_OTHER): Payer: 59 | Admitting: Orthopaedic Surgery

## 2019-04-09 ENCOUNTER — Ambulatory Visit: Payer: Self-pay

## 2019-04-09 ENCOUNTER — Encounter: Payer: Self-pay | Admitting: Orthopaedic Surgery

## 2019-04-09 VITALS — BP 107/70 | HR 95 | Wt 320.0 lb

## 2019-04-09 DIAGNOSIS — G8929 Other chronic pain: Secondary | ICD-10-CM | POA: Diagnosis not present

## 2019-04-09 DIAGNOSIS — M25561 Pain in right knee: Secondary | ICD-10-CM

## 2019-04-09 MED ORDER — LIDOCAINE HCL 1 % IJ SOLN
2.0000 mL | INTRAMUSCULAR | Status: AC | PRN
  Administered 2019-04-09: 2 mL

## 2019-04-09 MED ORDER — METHYLPREDNISOLONE ACETATE 40 MG/ML IJ SUSP
80.0000 mg | INTRAMUSCULAR | Status: AC | PRN
  Administered 2019-04-09: 11:00:00 80 mg via INTRA_ARTICULAR

## 2019-04-09 MED ORDER — BUPIVACAINE HCL 0.5 % IJ SOLN
2.0000 mL | INTRAMUSCULAR | Status: AC | PRN
  Administered 2019-04-09: 11:00:00 2 mL via INTRA_ARTICULAR

## 2019-04-09 NOTE — Progress Notes (Signed)
Office Visit Note   Patient: Jimmy Levine           Date of Birth: 11-23-1960           MRN: [id number] Visit Date: 04/09/2019              Requested by: Jimmy Spencer, FNP [address] [place],  [place] [address] PCP: Mechele Claude, MD   Assessment & Plan: Visit Diagnoses:  1. Chronic pain of right knee   2. Acute pain of right knee     Plan: [name] has had an acute exacerbation of her chronic right knee problem.  In January 2020 he had similar pain treated with prednisone with resolution.  Within the last month or 2 is had recurrence of the pain to the point of compromise.  He is not had any repeat injury or trauma.  He did try another prednisone regimen without much relief.  Pain is localized along the medial compartment of his knee.  Films are consistent with some degenerative changes in the medial compartment.  Long discussion regarding treatment options including MRI scan for diagnostic purposes or an intra-articular cortisone injection.  He preferred to try the injection and monitor his response.  He could have a combination of tear of the medial meniscus as well as degenerative changes in the medial compartment  Follow-Up Instructions: Return if symptoms worsen or fail to improve.   Orders:  Orders Placed This Encounter  Procedures  . Large Joint Inj: R knee  . XR KNEE 3 VIEW RIGHT   No orders of the defined types were placed in this encounter.     Procedures: Large Joint Inj: R knee on 04/09/2019 10:57 AM Indications: pain and diagnostic evaluation Details: 25 G 1.5 in needle, anteromedial approach  Arthrogram: No  Medications: 2 mL lidocaine 1 %; 2 mL bupivacaine 0.5 %; 80 mg methylPREDNISolone acetate 40 MG/ML Procedure, treatment alternatives, risks and benefits explained, specific risks discussed. Consent was given by the patient. Immediately prior to procedure a time out was called to verify the correct patient, procedure, equipment, support  staff and site/side marked as required. Patient was prepped and draped in the usual sterile fashion.       Clinical Data: No additional findings.   Subjective: Chief Complaint  Patient presents with  . Right Knee - Pain  Patient presents today for right knee pain. Patient states that he hurts yearly. He saw his PCP and was given a steroid dosepak. He said that it helped initially, but the pain has returned.  Initial onset of right knee pain about a year ago.  Was seen by his primary care physician in January of this year treated with prednisone.  This seemed to resolve the problem with his knee.  In the last month or 2 is had recurrence of his pain with poor response to another prednisone Dosepak.  He is presently not working but does spend a lot of time up and down.  The pain seems to be localized on the medial compartment of his knee.  Not much swelling.  No feeling of his knee giving way but does have a lot of popping and clicking.  HPI  Review of Systems   Objective: Vital Signs: BP 107/70   Pulse 95   Wt (!) 320 lb (145.2 kg)   BMI 43.40 kg/m   Physical Exam Constitutional:      Appearance: He is well-developed.  Eyes:     Pupils: Pupils  are equal, round, and reactive to light.  Pulmonary:     Effort: Pulmonary effort is normal.  Skin:    General: Skin is warm and dry.  Neurological:     Mental Status: He is alert and oriented to person, place, and time.  Psychiatric:        Behavior: Behavior normal.     Ortho Exam area of thickened skin in the prepatellar region of his right knee.  Patient relates that he might have "psoriasis".  No specific treatment.  No effusion.  Full extension and flexion over 100 degrees without instability.  Very minimal varus position with weightbearing.  Diffuse medial joint pain without popping or clicking.  Some patellar crepitation but not much pain.  No pain laterally.  No popliteal mass or discomfort.  No calf pain.  Leg raise  negative Specialty Comments:  No specialty comments available.  Imaging: Xr Knee 3 View Right  Result Date: 04/09/2019 Films of the right knee were obtained in several projections standing.  There is about 1 degree of varus of the right knee with mild decrease in the medial joint space.  No acute changes.  No ectopic calcification.  Mild subchondral sclerosis on both sides of the joint more medially than laterally.  Films are consistent with some degenerative changes in the medial compartment    PMFS History: Patient Active Problem List   Diagnosis Date Noted  . Pain in right knee 04/09/2019  . Morbid obesity ([place]) 03/26/2019   Past Medical History:  Diagnosis Date  . Allergy   . Arthritis   . Carpal tunnel syndrome   . GERD (gastroesophageal reflux disease)   . Kidney stone   . Sleep apnea    unsure    Family History  Problem Relation Age of Onset  . Hypertension Mother   . COPD Mother   . Arthritis Mother   . Heart disease Father   . Arthritis Father   . COPD Brother   . Arthritis Brother     Past Surgical History:  Procedure Laterality Date  . HAND TENOLYSIS    . HERNIA REPAIR     Social History   Occupational History  . Occupation: builder/welder  Tobacco Use  . Smoking status: Never Smoker  . Smokeless tobacco: Never Used  Substance and Sexual Activity  . Alcohol use: Not Currently    Frequency: Never    Comment: 10 yrs ago  . Drug use: Never  . Sexual activity: Yes    Birth control/protection: None

## 2019-04-09 NOTE — Telephone Encounter (Signed)
Would they cover a stress echo?

## 2019-04-10 ENCOUNTER — Encounter (HOSPITAL_COMMUNITY): Payer: 59

## 2019-04-10 NOTE — Telephone Encounter (Signed)
I will forward to [facility]

## 2019-04-10 NOTE — Telephone Encounter (Signed)
I will forward this to the [place] office

## 2019-04-10 NOTE — Telephone Encounter (Signed)
Jimmy Levine, Jimmy Levine, Jimmy Herald, MD; Bernita Raisin, RN        Hello All,   I have received an auth for a stress echo with Mr. [employer] insurance.   Thanks so much for helping me with this.   Josem Kaufmann # [id number] expires: 05/25/2019   Best,   Ciro Backer   Previous Messages

## 2019-04-14 NOTE — Telephone Encounter (Signed)
Attempted to reach patient for notification.  Voice mailbox not set up.

## 2019-04-16 ENCOUNTER — Telehealth: Payer: Self-pay | Admitting: Cardiovascular Disease

## 2019-04-16 NOTE — Telephone Encounter (Signed)

## 2019-04-17 ENCOUNTER — Other Ambulatory Visit: Payer: 59

## 2019-04-17 NOTE — Telephone Encounter (Signed)
Notified patient & is agreeable to doing the stress echo.  Will see if we can work out getting both the Echo and Stress Echo done on Monday, 04/28/19 with covid testing being done on Thursday evening, 04/24/19.

## 2019-04-21 ENCOUNTER — Other Ambulatory Visit: Payer: 59

## 2019-04-23 ENCOUNTER — Ambulatory Visit: Payer: 59 | Admitting: Orthopaedic Surgery

## 2019-04-23 ENCOUNTER — Other Ambulatory Visit: Payer: Self-pay

## 2019-04-23 NOTE — Telephone Encounter (Signed)
Patient made aware that we are unable to work it out for days below.  He is aware that we will call him when this is complete.

## 2019-04-24 ENCOUNTER — Telehealth: Payer: Self-pay | Admitting: Cardiovascular Disease

## 2019-04-24 NOTE — Telephone Encounter (Signed)
Pre-cert Verification for the following procedure    Stress Echo scheduled for 05-08-2019 at [facility]

## 2019-05-07 ENCOUNTER — Other Ambulatory Visit (HOSPITAL_COMMUNITY): Payer: 59

## 2019-05-08 ENCOUNTER — Other Ambulatory Visit (HOSPITAL_COMMUNITY): Payer: 59

## 2019-05-16 ENCOUNTER — Other Ambulatory Visit (HOSPITAL_COMMUNITY)
Admission: RE | Admit: 2019-05-16 | Discharge: 2019-05-16 | Disposition: A | Discharge status: Home / Self Care | Payer: 59 | Source: Ambulatory Visit | Attending: Cardiovascular Disease | Admitting: Cardiovascular Disease

## 2019-05-16 ENCOUNTER — Ambulatory Visit (HOSPITAL_COMMUNITY)
Admission: RE | Admit: 2019-05-16 | Discharge: 2019-05-16 | Disposition: A | Discharge status: Home / Self Care | Payer: 59 | Source: Ambulatory Visit | Attending: Cardiovascular Disease | Admitting: Cardiovascular Disease

## 2019-05-16 ENCOUNTER — Other Ambulatory Visit: Payer: Self-pay

## 2019-05-16 ENCOUNTER — Other Ambulatory Visit (HOSPITAL_COMMUNITY): Payer: 59

## 2019-05-16 DIAGNOSIS — Z20828 Contact with and (suspected) exposure to other viral communicable diseases: Secondary | ICD-10-CM | POA: Diagnosis not present

## 2019-05-16 DIAGNOSIS — R0609 Other forms of dyspnea: Secondary | ICD-10-CM

## 2019-05-16 DIAGNOSIS — R0600 Dyspnea, unspecified: Secondary | ICD-10-CM

## 2019-05-16 LAB — SARS CORONAVIRUS 2 (TAT 6-24 HRS): SARS Coronavirus 2: NEGATIVE

## 2019-05-16 NOTE — Progress Notes (Signed)
*  PRELIMINARY RESULTS* Echocardiogram 2D Echocardiogram has been performed.  Samuel Germany 05/16/2019, 10:04 AM

## 2019-05-19 ENCOUNTER — Encounter (HOSPITAL_COMMUNITY): Payer: Self-pay

## 2019-05-19 ENCOUNTER — Other Ambulatory Visit: Payer: Self-pay

## 2019-05-19 ENCOUNTER — Ambulatory Visit (HOSPITAL_COMMUNITY)
Admission: RE | Admit: 2019-05-19 | Discharge: 2019-05-19 | Disposition: A | Discharge status: Home / Self Care | Payer: 59 | Source: Ambulatory Visit | Attending: Cardiovascular Disease | Admitting: Cardiovascular Disease

## 2019-05-19 DIAGNOSIS — R5383 Other fatigue: Secondary | ICD-10-CM

## 2019-05-19 DIAGNOSIS — R0609 Other forms of dyspnea: Secondary | ICD-10-CM

## 2019-05-21 LAB — COLOGUARD: Cologuard: NEGATIVE

## 2019-06-25 ENCOUNTER — Ambulatory Visit: Payer: 59 | Attending: Family Medicine

## 2019-06-25 ENCOUNTER — Other Ambulatory Visit: Payer: Self-pay

## 2019-06-25 DIAGNOSIS — Z20822 Contact with and (suspected) exposure to covid-19: Secondary | ICD-10-CM

## 2019-06-26 LAB — NOVEL CORONAVIRUS, NAA: SARS-CoV-2, NAA: NOT DETECTED

## 2019-07-15 ENCOUNTER — Ambulatory Visit: Payer: 59 | Admitting: Cardiovascular Disease

## 2019-07-21 ENCOUNTER — Ambulatory Visit: Payer: 59 | Admitting: Cardiovascular Disease

## 2019-08-11 ENCOUNTER — Telehealth: Payer: Self-pay | Admitting: Cardiovascular Disease

## 2019-08-11 NOTE — Telephone Encounter (Signed)
FYI.  °Contacted patient regarding recall appointment, patient notified our office they did not wish to keep this appointment at this time.  Deleted recall from system. °

## 2019-10-05 ENCOUNTER — Other Ambulatory Visit: Payer: Self-pay | Admitting: Family Medicine

## 2020-04-13 ENCOUNTER — Other Ambulatory Visit: Payer: Self-pay | Admitting: Family Medicine

## 2020-04-13 ENCOUNTER — Other Ambulatory Visit: Payer: Self-pay | Admitting: Family

## 2020-04-13 DIAGNOSIS — M25561 Pain in right knee: Secondary | ICD-10-CM

## 2020-04-13 DIAGNOSIS — I10 Essential (primary) hypertension: Secondary | ICD-10-CM

## 2020-04-14 ENCOUNTER — Other Ambulatory Visit: Payer: Self-pay | Admitting: Family

## 2020-04-14 DIAGNOSIS — M25561 Pain in right knee: Secondary | ICD-10-CM

## 2020-04-14 DIAGNOSIS — I10 Essential (primary) hypertension: Secondary | ICD-10-CM

## 2020-04-14 MED ORDER — DICLOFENAC SODIUM 75 MG PO TBEC: 75.0000 mg | DELAYED_RELEASE_TABLET | Freq: Two times a day (BID) | ORAL | 0 refills | Status: DC

## 2020-04-14 MED ORDER — ATORVASTATIN CALCIUM 20 MG PO TABS: 20.0000 mg | ORAL_TABLET | Freq: Every day | ORAL | 0 refills | Status: DC

## 2020-05-03 ENCOUNTER — Ambulatory Visit (INDEPENDENT_AMBULATORY_CARE_PROVIDER_SITE_OTHER): Payer: 59 | Admitting: Family Medicine

## 2020-05-03 ENCOUNTER — Encounter: Payer: Self-pay | Admitting: Family Medicine

## 2020-05-03 DIAGNOSIS — M25561 Pain in right knee: Secondary | ICD-10-CM

## 2020-05-03 DIAGNOSIS — I10 Essential (primary) hypertension: Secondary | ICD-10-CM

## 2020-05-03 DIAGNOSIS — E782 Mixed hyperlipidemia: Secondary | ICD-10-CM

## 2020-05-03 MED ORDER — LISINOPRIL-HYDROCHLOROTHIAZIDE 20-12.5 MG PO TABS: 1.0000 | ORAL_TABLET | Freq: Every day | ORAL | 1 refills | Status: DC

## 2020-05-03 MED ORDER — SILDENAFIL CITRATE 20 MG PO TABS: ORAL_TABLET | ORAL | 6 refills | Status: DC

## 2020-05-03 MED ORDER — DICLOFENAC SODIUM 75 MG PO TBEC: 75.0000 mg | DELAYED_RELEASE_TABLET | Freq: Two times a day (BID) | ORAL | 1 refills | Status: DC

## 2020-05-03 MED ORDER — ATORVASTATIN CALCIUM 20 MG PO TABS: 20.0000 mg | ORAL_TABLET | Freq: Every day | ORAL | 1 refills | Status: DC

## 2020-05-03 NOTE — Progress Notes (Signed)
Subjective:  Patient ID: Jimmy Levine, male    DOB: [birth year]  Age: 59 y.o. MRN: [id number]  CC: No chief complaint on file.   HPI Jimmy Levine presents for  follow-up of hypertension. Patient has no history of headache chest pain or shortness of breath or recent cough. Patient also denies symptoms of TIA such as focal numbness or weakness. Patient denies side effects from medication. States taking it regularly.   in for follow-up of elevated cholesterol. Doing well without complaints on current medication. Denies side effects of statin including myalgia and arthralgia and nausea. Currently no chest pain, shortness of breath or other cardiovascular related symptoms noted.    History Duante has a past medical history of Allergy, Arthritis, Carpal tunnel syndrome, GERD (gastroesophageal reflux disease), Kidney stone, and Sleep apnea.   He has a past surgical history that includes Hernia repair and Hand Tenolysis.   His family history includes Arthritis in his brother, father, and mother; COPD in his brother and mother; Heart disease in his father; Hypertension in his mother.He reports that he has never smoked. He has never used smokeless tobacco. He reports previous alcohol use. He reports that he does not use drugs.  No current outpatient medications on file prior to visit.   No current facility-administered medications on file prior to visit.    ROS Review of Systems  Constitutional: Negative for fever.  HENT: Positive for congestion and sore throat.   Respiratory: Negative for shortness of breath.   Cardiovascular: Negative for chest pain.  Musculoskeletal: Positive for arthralgias.  Skin: Negative for rash.    Objective:  There were no vitals taken for this visit.  BP Readings from Last 3 Encounters:  04/09/19 107/70  04/02/19 (!) 147/93  03/26/19 (!) 165/104    Wt Readings from Last 3 Encounters:  04/09/19 (!) 320 lb (145.2 kg)  04/02/19 (!) 320 lb  6.4 oz (145.3 kg)  03/26/19 (!) 320 lb (145.2 kg)     Physical Exam  Exam deferred. Pt. Harboring due to COVID 19. Phone visit performed.   Assessment & Plan:   Diagnoses and all orders for this visit:  Essential hypertension -     CBC with Differential/Platelet -     CMP14+EGFR -     lisinopril-hydrochlorothiazide (ZESTORETIC) 20-12.5 MG tablet; Take 1 tablet by mouth daily.  Morbid obesity ([place]) -     CBC with Differential/Platelet  Mixed hyperlipidemia -     CBC with Differential/Platelet -     CMP14+EGFR -     Lipid panel  Acute pain of right knee -     diclofenac (VOLTAREN) 75 MG EC tablet; Take 1 tablet (75 mg total) by mouth 2 (two) times daily.  Other orders -     atorvastatin (LIPITOR) 20 MG tablet; Take 1 tablet (20 mg total) by mouth daily. -     sildenafil (REVATIO) 20 MG tablet; TAKE 2 TO 5 TABLETS BY MOUTH AS NEEDED   Allergies as of 05/03/2020      Reactions   Morphine And Related    Hot       Medication List       Accurate as of May 03, 2020 11:04 AM. If you have any questions, ask your nurse or doctor.        atorvastatin 20 MG tablet Commonly known as: LIPITOR Take 1 tablet (20 mg total) by mouth daily.   diclofenac 75 MG EC tablet Commonly known as: VOLTAREN Take 1 tablet (  75 mg total) by mouth 2 (two) times daily.   lisinopril-hydrochlorothiazide 20-12.5 MG tablet Commonly known as: ZESTORETIC Take 1 tablet by mouth daily.   sildenafil 20 MG tablet Commonly known as: REVATIO TAKE 2 TO 5 TABLETS BY MOUTH AS NEEDED       Meds ordered this encounter  Medications  . atorvastatin (LIPITOR) 20 MG tablet    Sig: Take 1 tablet (20 mg total) by mouth daily.    Dispense:  90 tablet    Refill:  1  . diclofenac (VOLTAREN) 75 MG EC tablet    Sig: Take 1 tablet (75 mg total) by mouth 2 (two) times daily.    Dispense:  180 tablet    Refill:  1  . lisinopril-hydrochlorothiazide (ZESTORETIC) 20-12.5 MG tablet    Sig: Take 1 tablet by  mouth daily.    Dispense:  90 tablet    Refill:  1  . sildenafil (REVATIO) 20 MG tablet    Sig: TAKE 2 TO 5 TABLETS BY MOUTH AS NEEDED    Dispense:  50 tablet    Refill:  6    Virtual Visit via telephone Note  I discussed the limitations, risks, security and privacy concerns of performing an evaluation and management service by telephone and the availability of in person appointments. I also discussed with the patient that there may be a patient responsible charge related to this service. The patient expressed understanding and agreed to proceed. Pt. Is at home. Dr. Stacks is in his office.  Follow Up Instructions:   I discussed the assessment and treatment plan with the patient. The patient was provided an opportunity to ask questions and all were answered. The patient agreed with the plan and demonstrated an understanding of the instructions.   The patient was advised to call back or seek an in-person evaluation if the symptoms worsen or if the condition fails to improve as anticipated.  Total minutes including chart review and phone contact time: 18   Follow-up: Return in about 6 months (around 10/31/2020).  Warren Stacks, M.D. 

## 2022-05-24 ENCOUNTER — Encounter: Payer: Self-pay | Admitting: Family Medicine

## 2022-05-24 ENCOUNTER — Ambulatory Visit (INDEPENDENT_AMBULATORY_CARE_PROVIDER_SITE_OTHER): Payer: 59 | Admitting: Family Medicine

## 2022-05-24 DIAGNOSIS — I10 Essential (primary) hypertension: Secondary | ICD-10-CM | POA: Diagnosis not present

## 2022-05-24 LAB — CMP14+EGFR
ALT: 26 IU/L (ref 0–44)
AST: 23 IU/L (ref 0–40)
Albumin/Globulin Ratio: 2 (ref 1.2–2.2)
Albumin: 4.4 g/dL (ref 3.9–4.9)
Alkaline Phosphatase: 65 IU/L (ref 44–121)
BUN/Creatinine Ratio: 13 (ref 10–24)
BUN: 13 mg/dL (ref 8–27)
Bilirubin Total: 0.7 mg/dL (ref 0.0–1.2)
CO2: 25 mmol/L (ref 20–29)
Calcium: 9.3 mg/dL (ref 8.6–10.2)
Chloride: 102 mmol/L (ref 96–106)
Creatinine, Ser: 0.97 mg/dL (ref 0.76–1.27)
Globulin, Total: 2.2 g/dL (ref 1.5–4.5)
Glucose: 81 mg/dL (ref 70–99)
Potassium: 4.6 mmol/L (ref 3.5–5.2)
Sodium: 142 mmol/L (ref 134–144)
Total Protein: 6.6 g/dL (ref 6.0–8.5)
eGFR: 88 mL/min/{1.73_m2} (ref >59)

## 2022-05-24 LAB — CBC WITH DIFFERENTIAL/PLATELET
Basophils Absolute: 0.1 10*3/uL (ref 0.0–0.2)
Basos: 1 %
EOS (ABSOLUTE): 0.1 10*3/uL (ref 0.0–0.4)
Eos: 2 %
Hematocrit: 48.4 % (ref 37.5–51.0)
Hemoglobin: 16.3 g/dL (ref 13.0–17.7)
Immature Grans (Abs): 0 10*3/uL (ref 0.0–0.1)
Immature Granulocytes: 0 %
Lymphocytes Absolute: 1.5 10*3/uL (ref 0.7–3.1)
Lymphs: 27 %
MCH: 30.5 pg (ref 26.6–33.0)
MCHC: 33.7 g/dL (ref 31.5–35.7)
MCV: 91 fL (ref 79–97)
Monocytes Absolute: 0.6 10*3/uL (ref 0.1–0.9)
Monocytes: 10 %
Neutrophils Absolute: 3.3 10*3/uL (ref 1.4–7.0)
Neutrophils: 60 %
Platelets: 198 10*3/uL (ref 150–450)
RBC: 5.34 x10E6/uL (ref 4.14–5.80)
RDW: 12.1 % (ref 11.6–15.4)
WBC: 5.6 10*3/uL (ref 3.4–10.8)

## 2022-05-24 LAB — TSH: TSH: 1.55 u[IU]/mL (ref 0.450–4.500)

## 2022-05-24 MED ORDER — LISINOPRIL-HYDROCHLOROTHIAZIDE 20-12.5 MG PO TABS: 1.0000 | ORAL_TABLET | Freq: Every day | ORAL | 1 refills | Status: DC

## 2022-05-24 NOTE — Progress Notes (Addendum)
BP (!) 175/124   Pulse 74   Ht 6' (1.829 m)   Wt (!) 329 lb (149.2 kg)   SpO2 95%   BMI 44.62 kg/m    Subjective:   Patient ID: Jimmy Levine, male    DOB: 11/26/1960, 61 y.o.   MRN: [id number]  HPI: Jimmy Levine is a 61 y.o. male presenting on 05/24/2022 for Dizziness (No chest pain, blurry vision, nausea, vomiting)   HPI Dizziness and off-balance and blurred vision. Patient is coming in today with complaints of dizziness and off-balance and blurred vision.  He says the blurred vision has been gradually coming on over the past year and decreased energy and sleepiness has been gradually coming on over the past year but this morning he woke up feeling dizzy and off balance.  He is not having a spinning sensation, he says he has had vertigo in the past but does not necessarily feel like this.  He denies any focal numbness or weakness.  Patient's blood pressure is very elevated at 183/116 and 175/124.  He used to take a blood pressure pill but is not on it currently and has not been for the past 2 years.  He has also been having some nausea and vomiting.  This all started this morning.  His blood pressure being elevated has been going on for some time  Relevant past medical, surgical, family and social history reviewed and updated as indicated. Interim medical history since our last visit reviewed. Allergies and medications reviewed and updated.  Review of Systems  Constitutional:  Negative for chills and fever.  Eyes:  Positive for visual disturbance. Negative for discharge.  Respiratory:  Negative for shortness of breath and wheezing.   Cardiovascular:  Negative for chest pain and leg swelling.  Gastrointestinal:  Positive for nausea and vomiting.  Musculoskeletal:  Negative for back pain and gait problem.  Skin:  Negative for rash.  Neurological:  Positive for dizziness and light-headedness. Negative for speech difficulty, weakness and numbness.  All other systems  reviewed and are negative.   Per HPI unless specifically indicated above   Allergies as of 05/24/2022       Reactions   Morphine And Related    Hot         Medication List        Accurate as of May 24, 2022  9:55 AM. If you have any questions, ask your nurse or doctor.          STOP taking these medications    atorvastatin 20 MG tablet Commonly known as: LIPITOR Stopped by: Worthy Rancher, MD   diclofenac 75 MG EC tablet Commonly known as: VOLTAREN Stopped by: Worthy Rancher, MD   sildenafil 20 MG tablet Commonly known as: REVATIO Stopped by: Fransisca Kaufmann Larkin Alfred, MD       TAKE these medications    lisinopril-hydrochlorothiazide 20-12.5 MG tablet Commonly known as: ZESTORETIC Take 1 tablet by mouth daily.   naproxen sodium 220 MG tablet Commonly known as: ALEVE Take 220 mg by mouth daily as needed.         Objective:   BP (!) 175/124   Pulse 74   Ht 6' (1.829 m)   Wt (!) 329 lb (149.2 kg)   SpO2 95%   BMI 44.62 kg/m   Wt Readings from Last 3 Encounters:  05/24/22 (!) 329 lb (149.2 kg)  04/09/19 (!) 320 lb (145.2 kg)  04/02/19 (!) 320 lb 6.4 oz (145.3 kg)  Physical Exam Vitals and nursing note reviewed.  Constitutional:      General: He is not in acute distress.    Appearance: He is well-developed. He is not diaphoretic.  Eyes:     General: No scleral icterus.    Conjunctiva/sclera: Conjunctivae normal.  Neck:     Thyroid: No thyromegaly.  Cardiovascular:     Rate and Rhythm: Normal rate and regular rhythm.     Heart sounds: Normal heart sounds. No murmur heard. Pulmonary:     Effort: Pulmonary effort is normal. No respiratory distress.     Breath sounds: Normal breath sounds. No wheezing.  Musculoskeletal:        General: Normal range of motion.     Cervical back: Neck supple.  Lymphadenopathy:     Cervical: No cervical adenopathy.  Skin:    General: Skin is warm and dry.     Findings: No rash.  Neurological:      General: No focal deficit present.     Mental Status: He is alert and oriented to person, place, and time.     Cranial Nerves: No cranial nerve deficit.     Sensory: No sensory deficit.     Motor: No weakness.     Coordination: Coordination normal.     Gait: Gait normal.  Psychiatric:        Behavior: Behavior normal.       Assessment & Plan:   Problem List Items Addressed This Visit   None Visit Diagnoses     Essential hypertension       Relevant Medications   lisinopril-hydrochlorothiazide (ZESTORETIC) 20-12.5 MG tablet   Other Relevant Orders   CBC with Differential/Platelet   CMP14+EGFR   TSH   MR Brain Wo Contrast       With elevated blood pressure and dizziness and nausea vomiting, concern for hypertensive emergency.  Will order MRI brain and restart blood pressure medicine.  MRI did not improved by insurance right so we are trying through their authorization process.  Instructed patient if anything worsens or if he gets focal numbness or weakness go to emergency department. Follow up plan: Return in about 2 weeks (around 06/07/2022), or if symptoms worsen or fail to improve, for Hypertension recheck with PCP.  Counseling provided for all of the vaccine components Orders Placed This Encounter  Procedures   MR Brain Wo Contrast   CBC with Differential/Platelet   CMP14+EGFR   TSH    Caryl Pina, MD [facility] 05/24/2022, 9:55 AM

## 2022-05-31 ENCOUNTER — Ambulatory Visit
Admission: RE | Admit: 2022-05-31 | Discharge: 2022-05-31 | Disposition: A | Discharge status: Home / Self Care | Payer: 59 | Source: Ambulatory Visit | Attending: Family Medicine | Admitting: Family Medicine

## 2022-05-31 DIAGNOSIS — I10 Essential (primary) hypertension: Secondary | ICD-10-CM

## 2022-06-05 ENCOUNTER — Ambulatory Visit (INDEPENDENT_AMBULATORY_CARE_PROVIDER_SITE_OTHER): Payer: 59 | Admitting: Family Medicine

## 2022-06-05 ENCOUNTER — Ambulatory Visit (INDEPENDENT_AMBULATORY_CARE_PROVIDER_SITE_OTHER): Payer: 59

## 2022-06-05 ENCOUNTER — Encounter: Payer: Self-pay | Admitting: Family Medicine

## 2022-06-05 VITALS — BP 132/85 | HR 81 | Temp 97.4°F | Ht 72.0 in | Wt 317.0 lb

## 2022-06-05 DIAGNOSIS — R5383 Other fatigue: Secondary | ICD-10-CM | POA: Diagnosis not present

## 2022-06-05 DIAGNOSIS — M542 Cervicalgia: Secondary | ICD-10-CM

## 2022-06-05 DIAGNOSIS — R601 Generalized edema: Secondary | ICD-10-CM | POA: Diagnosis not present

## 2022-06-05 DIAGNOSIS — I517 Cardiomegaly: Secondary | ICD-10-CM | POA: Diagnosis not present

## 2022-06-05 DIAGNOSIS — R0601 Orthopnea: Secondary | ICD-10-CM

## 2022-06-05 DIAGNOSIS — J90 Pleural effusion, not elsewhere classified: Secondary | ICD-10-CM | POA: Diagnosis not present

## 2022-06-05 DIAGNOSIS — M47812 Spondylosis without myelopathy or radiculopathy, cervical region: Secondary | ICD-10-CM | POA: Diagnosis not present

## 2022-06-05 LAB — BAYER DCA HB A1C WAIVED: HB A1C (BAYER DCA - WAIVED): 5.4 % (ref 4.8–5.6)

## 2022-06-05 MED ORDER — FUROSEMIDE 40 MG PO TABS: 40.0000 mg | ORAL_TABLET | Freq: Every day | ORAL | 3 refills | Status: DC

## 2022-06-05 MED ORDER — CELECOXIB 200 MG PO CAPS: 200.0000 mg | ORAL_CAPSULE | Freq: Every day | ORAL | 5 refills | Status: DC

## 2022-06-05 MED ORDER — VALSARTAN 160 MG PO TABS: 160.0000 mg | ORAL_TABLET | Freq: Every day | ORAL | 3 refills | Status: DC

## 2022-06-05 NOTE — Progress Notes (Signed)
Subjective:  Patient ID: Jimmy Levine, male    DOB: 1961/05/23  Age: 61 y.o. MRN: [id number]  CC: Follow-up and Hypertension   HPI Jimmy Levine presents for  follow-up of hypertension. Patient has no history of headache chest pain or shortness of breath or recent cough. Patient also denies symptoms of TIA such as focal numbness or weakness. Patient denies side effects from medication. States taking it regularly.  Not as dizzy in the morning. No drive, no energy. A little Numbness around the lips. Went to [facility] for MRI, couldn't stand the enclosure. Sleeps on left side. Can't lay flat. Tried MRI. Everything went numb and he couldn't stay on the table.   Describes leg edema as extreme. Improved after starting fluid pill.   History Jimmy Levine has a past medical history of Allergy, Arthritis, Carpal tunnel syndrome, GERD (gastroesophageal reflux disease), Kidney stone, and Sleep apnea.   He has a past surgical history that includes Hernia repair and Hand Tenolysis.   His family history includes Arthritis in his brother, father, and mother; COPD in his brother and mother; Heart disease in his father; Hypertension in his mother.He reports that he has never smoked. He has never used smokeless tobacco. He reports that he does not currently use alcohol. He reports that he does not use drugs.  No current outpatient medications on file prior to visit.   No current facility-administered medications on file prior to visit.    ROS Review of Systems  Constitutional: Negative.   HENT: Negative.    Eyes:  Negative for visual disturbance.  Respiratory:  Negative for cough and shortness of breath.   Cardiovascular:  Positive for leg swelling (extreme until started on fluid medon 11/22. Now has lost 12 lbs). Negative for chest pain.  Gastrointestinal:  Negative for abdominal pain, diarrhea, nausea and vomiting.       Gurgling noted on right side of the abdomen.   Genitourinary:   Negative for difficulty urinating.  Musculoskeletal:  Positive for neck pain (daily headache. Cracks and pops in neck. Snaps the neck to the left to get relief). Negative for arthralgias and myalgias.  Skin:  Negative for rash.  Neurological:  Negative for headaches.  Psychiatric/Behavioral:  Negative for sleep disturbance.     Objective:  BP 132/85   Pulse 81   Temp (!) 97.4 F (36.3 C)   Ht 6' (1.829 m)   Wt (!) 317 lb (143.8 kg)   SpO2 96%   BMI 42.99 kg/m   BP Readings from Last 3 Encounters:  06/05/22 132/85  05/24/22 (!) 175/124  04/09/19 107/70    Wt Readings from Last 3 Encounters:  06/05/22 (!) 317 lb (143.8 kg)  05/24/22 (!) 329 lb (149.2 kg)  04/09/19 (!) 320 lb (145.2 kg)     Physical Exam Constitutional:      General: He is not in acute distress.    Appearance: He is well-developed.  HENT:     Head: Normocephalic and atraumatic.     Right Ear: External ear normal.     Left Ear: External ear normal.     Nose: Nose normal.  Eyes:     Conjunctiva/sclera: Conjunctivae normal.     Pupils: Pupils are equal, round, and reactive to light.  Cardiovascular:     Rate and Rhythm: Normal rate and regular rhythm.     Heart sounds: Normal heart sounds. No murmur heard. Pulmonary:     Effort: Pulmonary effort is normal. No respiratory distress.  Breath sounds: Normal breath sounds. No wheezing or rales.  Abdominal:     Palpations: Abdomen is soft.     Tenderness: There is no abdominal tenderness.  Musculoskeletal:        General: Normal range of motion.     Cervical back: Normal range of motion and neck supple.  Skin:    General: Skin is warm and dry.  Neurological:     Mental Status: He is alert and oriented to person, place, and time.     Deep Tendon Reflexes: Reflexes are normal and symmetric.  Psychiatric:        Behavior: Behavior normal.        Thought Content: Thought content normal.        Judgment: Judgment normal.       Assessment & Plan:    Jimmy Levine was seen today for follow-up and hypertension.  Diagnoses and all orders for this visit:  Generalized edema -     DG Chest 2 View; Future -     Brain natriuretic peptide -     BMP8+EGFR -     EKG 12-Lead -     Bayer DCA Hb A1c Waived -     CMP14+EGFR -     Ambulatory referral to Cardiology  Fatigue, unspecified type -     DG Chest 2 View; Future -     Brain natriuretic peptide -     BMP8+EGFR -     EKG 12-Lead -     Bayer DCA Hb A1c Waived -     CMP14+EGFR -     Ambulatory referral to Cardiology  Neck pain -     DG Chest 2 View; Future -     Brain natriuretic peptide -     BMP8+EGFR -     EKG 12-Lead -     Bayer DCA Hb A1c Waived -     CMP14+EGFR -     DG Cervical Spine Complete; Future  Orthopnea -     DG Chest 2 View; Future -     Brain natriuretic peptide -     BMP8+EGFR -     EKG 12-Lead -     Bayer DCA Hb A1c Waived -     CMP14+EGFR -     Ambulatory referral to Cardiology  Cardiomegaly -     Ambulatory referral to Cardiology  Other orders -     valsartan (DIOVAN) 160 MG tablet; Take 1 tablet (160 mg total) by mouth daily. -     furosemide (LASIX) 40 MG tablet; Take 1 tablet (40 mg total) by mouth daily. -     celecoxib (CELEBREX) 200 MG capsule; Take 1 capsule (200 mg total) by mouth daily. For joints With food   Allergies as of 06/05/2022       Reactions   Morphine And Related    Hot         Medication List        Accurate as of June 05, 2022  8:36 PM. If you have any questions, ask your nurse or doctor.          STOP taking these medications    lisinopril-hydrochlorothiazide 20-12.5 MG tablet Commonly known as: ZESTORETIC Stopped by: Claretta Fraise, MD   naproxen sodium 220 MG tablet Commonly known as: ALEVE Stopped by: Claretta Fraise, MD       TAKE these medications    celecoxib 200 MG capsule Commonly known as: CeleBREX Take 1 capsule (200 mg total)  by mouth daily. For joints With food Started by: Claretta Fraise, MD   furosemide 40 MG tablet Commonly known as: LASIX Take 1 tablet (40 mg total) by mouth daily. Started by: Claretta Fraise, MD   valsartan 160 MG tablet Commonly known as: DIOVAN Take 1 tablet (160 mg total) by mouth daily. Started by: Claretta Fraise, MD        Meds ordered this encounter  Medications   valsartan (DIOVAN) 160 MG tablet    Sig: Take 1 tablet (160 mg total) by mouth daily.    Dispense:  90 tablet    Refill:  3   furosemide (LASIX) 40 MG tablet    Sig: Take 1 tablet (40 mg total) by mouth daily.    Dispense:  30 tablet    Refill:  3   celecoxib (CELEBREX) 200 MG capsule    Sig: Take 1 capsule (200 mg total) by mouth daily. For joints With food    Dispense:  30 capsule    Refill:  5    Likely cardiomyopathy, treat as CHF pending Cardiology evaluation  Follow-up: Return in about 2 weeks (around 06/19/2022).  Claretta Fraise, M.D.

## 2022-06-06 LAB — CMP14+EGFR
ALT: 29 IU/L (ref 0–44)
AST: 22 IU/L (ref 0–40)
Albumin/Globulin Ratio: 2.1 (ref 1.2–2.2)
Albumin: 4.6 g/dL (ref 3.9–4.9)
Alkaline Phosphatase: 66 IU/L (ref 44–121)
BUN/Creatinine Ratio: 14 (ref 10–24)
BUN: 15 mg/dL (ref 8–27)
Bilirubin Total: 0.6 mg/dL (ref 0.0–1.2)
CO2: 26 mmol/L (ref 20–29)
Calcium: 9.9 mg/dL (ref 8.6–10.2)
Chloride: 99 mmol/L (ref 96–106)
Creatinine, Ser: 1.09 mg/dL (ref 0.76–1.27)
Globulin, Total: 2.2 g/dL (ref 1.5–4.5)
Glucose: 89 mg/dL (ref 70–99)
Potassium: 4.6 mmol/L (ref 3.5–5.2)
Sodium: 141 mmol/L (ref 134–144)
Total Protein: 6.8 g/dL (ref 6.0–8.5)
eGFR: 77 mL/min/{1.73_m2} (ref >59)

## 2022-06-06 LAB — BRAIN NATRIURETIC PEPTIDE: BNP: <2.5 pg/mL (ref 0.0–100.0)

## 2022-06-07 NOTE — Progress Notes (Signed)
Hello Chamar,  Your lab result is normal and/or stable.Some minor variations that are not significant are commonly marked abnormal, but do not represent any medical problem for you.  Best regards, Morgan Rennert, M.D.

## 2022-06-19 ENCOUNTER — Ambulatory Visit (INDEPENDENT_AMBULATORY_CARE_PROVIDER_SITE_OTHER): Payer: 59 | Admitting: Family Medicine

## 2022-06-19 ENCOUNTER — Encounter: Payer: Self-pay | Admitting: Family Medicine

## 2022-06-19 ENCOUNTER — Ambulatory Visit: Payer: 59

## 2022-06-19 VITALS — BP 135/83 | HR 93 | Temp 98.1°F | Ht 72.0 in | Wt 321.6 lb

## 2022-06-19 DIAGNOSIS — Z1211 Encounter for screening for malignant neoplasm of colon: Secondary | ICD-10-CM | POA: Diagnosis not present

## 2022-06-19 DIAGNOSIS — R5383 Other fatigue: Secondary | ICD-10-CM | POA: Diagnosis not present

## 2022-06-19 DIAGNOSIS — R601 Generalized edema: Secondary | ICD-10-CM

## 2022-06-19 NOTE — Progress Notes (Signed)
Subjective:  Patient ID: Jimmy Levine, male    DOB: September 04, 1960  Age: 61 y.o. MRN: [id number]  CC: Follow-up and Edema   HPI Jimmy Levine presents for less swollen. Dizziness is gone .  Energy is still poor.  Of note is that he does not have time to exercise or eat right.  He says that is just the way it is in [place].  He has to go eat fast food for meals because that is all that is available.  He works long hours so he can exercise.  However, he says he will try to exercise more and he just wants to be cleared to get out and do that.  Of note is that he is seeing cardiology in 2 days.  Wants to do cologuard.      06/19/2022    8:15 AM 06/19/2022    8:09 AM 06/05/2022    8:01 AM  Depression screen PHQ 2/9  Decreased Interest 0 0 0  Down, Depressed, Hopeless 0 0 0  PHQ - 2 Score 0 0 0  Altered sleeping 1    Tired, decreased energy 2    Change in appetite 0    Feeling bad or failure about yourself  0    Trouble concentrating 0    Moving slowly or fidgety/restless 0    Suicidal thoughts 0    PHQ-9 Score 3    Difficult doing work/chores Somewhat difficult      History Jimmy Levine has a past medical history of Allergy, Arthritis, Carpal tunnel syndrome, GERD (gastroesophageal reflux disease), Kidney stone, and Sleep apnea.   He has a past surgical history that includes Hernia repair and Hand Tenolysis.   His family history includes Arthritis in his brother, father, and mother; COPD in his brother and mother; Heart disease in his father; Hypertension in his mother.He reports that he has never smoked. He has never used smokeless tobacco. He reports that he does not currently use alcohol. He reports that he does not use drugs.    ROS Review of Systems  Constitutional:  Positive for fatigue. Negative for fever.  Respiratory:  Negative for shortness of breath.   Cardiovascular:  Negative for chest pain.  Musculoskeletal:  Negative for arthralgias.  Skin:  Negative  for rash.    Objective:  BP 135/83   Pulse 93   Temp 98.1 F (36.7 C)   Ht 6' (1.829 m)   Wt (!) 321 lb 9.6 oz (145.9 kg)   SpO2 92%   BMI 43.62 kg/m   BP Readings from Last 3 Encounters:  06/19/22 135/83  06/05/22 132/85  05/24/22 (!) 175/124    Wt Readings from Last 3 Encounters:  06/19/22 (!) 321 lb 9.6 oz (145.9 kg)  06/05/22 (!) 317 lb (143.8 kg)  05/24/22 (!) 329 lb (149.2 kg)     Physical Exam Vitals reviewed.  Constitutional:      Appearance: He is well-developed.  HENT:     Head: Normocephalic and atraumatic.     Right Ear: External ear normal.     Left Ear: External ear normal.     Mouth/Throat:     Pharynx: No oropharyngeal exudate or posterior oropharyngeal erythema.  Eyes:     Pupils: Pupils are equal, round, and reactive to light.  Cardiovascular:     Rate and Rhythm: Normal rate and regular rhythm.     Heart sounds: No murmur heard. Pulmonary:     Effort: No respiratory distress.  Breath sounds: Normal breath sounds.  Musculoskeletal:     Cervical back: Normal range of motion and neck supple.  Neurological:     Mental Status: He is alert and oriented to person, place, and time.       Assessment & Plan:   Jimmy Levine was seen today for follow-up and edema.  Diagnoses and all orders for this visit:  Generalized edema -     BMP8+EGFR -     Testosterone,Free and Total -     Vitamin B12 -     Folate -     VITAMIN D 25 Hydroxy (Vit-D Deficiency, Fractures)  Screen for colon cancer -     Cologuard  Morbid obesity (HCC) -     Amb ref to Medical Nutrition Therapy-MNT  Fatigue, unspecified type -     Amb ref to Medical Nutrition Therapy-MNT   We discussed various possibilities fro fatigue. He realizes he needs to lose weight and will work on exercise as well as see the nutritionist.    I am having Jimmy Levine "[name]" maintain his valsartan, furosemide, and celecoxib.  Allergies as of 06/19/2022       Reactions   Morphine  And Related    Hot         Medication List        Accurate as of June 19, 2022  9:58 AM. If you have any questions, ask your nurse or doctor.          celecoxib 200 MG capsule Commonly known as: CeleBREX Take 1 capsule (200 mg total) by mouth daily. For joints With food   furosemide 40 MG tablet Commonly known as: LASIX Take 1 tablet (40 mg total) by mouth daily.   valsartan 160 MG tablet Commonly known as: DIOVAN Take 1 tablet (160 mg total) by mouth daily.         Follow-up: No follow-ups on file.  Claretta Fraise, M.D.

## 2022-06-21 NOTE — Progress Notes (Deleted)
Cardiology Office Note   Date:  06/21/2022   ID:  Jimmy Levine, DOB [birth year], MRN [id number]  PCP:  Mechele Claude, MD  Cardiologist:   Prentice Docker, MD (Inactive) Referring:  ***  No chief complaint on file.     History of Present Illness: Jimmy Levine is a 61 y.o. male who presents for ***    He was seen by Dr. Purvis Sheffield in 2020 and had a normal echo.  ***    Past Medical History:  Diagnosis Date   Allergy    Arthritis    Carpal tunnel syndrome    GERD (gastroesophageal reflux disease)    Kidney stone    Sleep apnea    unsure    Past Surgical History:  Procedure Laterality Date   HAND TENOLYSIS     HERNIA REPAIR       Current Outpatient Medications  Medication Sig Dispense Refill   celecoxib (CELEBREX) 200 MG capsule Take 1 capsule (200 mg total) by mouth daily. For joints With food 30 capsule 5   furosemide (LASIX) 40 MG tablet Take 1 tablet (40 mg total) by mouth daily. 30 tablet 3   valsartan (DIOVAN) 160 MG tablet Take 1 tablet (160 mg total) by mouth daily. 90 tablet 3   No current facility-administered medications for this visit.    Allergies:   Morphine and related    Social History:  The patient  reports that he has never smoked. He has never used smokeless tobacco. He reports that he does not currently use alcohol. He reports that he does not use drugs.   Family History:  The patient's ***family history includes Arthritis in his brother, father, and mother; COPD in his brother and mother; Heart disease in his father; Hypertension in his mother.    ROS:  Please see the history of present illness.   Otherwise, review of systems are positive for {NONE DEFAULTED:18576}.   All other systems are reviewed and negative.    PHYSICAL EXAM: VS:  There were no vitals taken for this visit. , BMI There is no height or weight on file to calculate BMI. GENERAL:  Well appearing HEENT:  Pupils equal round and reactive, fundi not  visualized, oral mucosa unremarkable NECK:  No jugular venous distention, waveform within normal limits, carotid upstroke brisk and symmetric, no bruits, no thyromegaly LYMPHATICS:  No cervical, inguinal adenopathy LUNGS:  Clear to auscultation bilaterally BACK:  No CVA tenderness CHEST:  Unremarkable HEART:  PMI not displaced or sustained,S1 and S2 within normal limits, no S3, no S4, no clicks, no rubs, *** murmurs ABD:  Flat, positive bowel sounds normal in frequency in pitch, no bruits, no rebound, no guarding, no midline pulsatile mass, no hepatomegaly, no splenomegaly EXT:  2 plus pulses throughout, no edema, no cyanosis no clubbing SKIN:  No rashes no nodules NEURO:  Cranial nerves II through XII grossly intact, motor grossly intact throughout PSYCH:  Cognitively intact, oriented to person place and time    EKG:  EKG {ACTION; IS/IS [id number]} ordered today. The ekg ordered today demonstrates ***   Recent Labs: 05/24/2022: Hemoglobin 16.3; Platelets 198; TSH 1.550 06/05/2022: ALT 29; BNP <2.5 06/19/2022: BUN 15; Creatinine, Ser 1.02; Potassium 4.6; Sodium 143    Lipid Panel    Component Value Date/Time   CHOL 170 03/26/2019 1638   TRIG 205 (H) 03/26/2019 1638   HDL 38 (L) 03/26/2019 1638   CHOLHDL 4.5 03/26/2019 1638   LDLCALC 97 03/26/2019 1638  Wt Readings from Last 3 Encounters:  06/19/22 (!) 321 lb 9.6 oz (145.9 kg)  06/05/22 (!) 317 lb (143.8 kg)  05/24/22 (!) 329 lb (149.2 kg)      Other studies Reviewed: Additional studies/ records that were reviewed today include: ***. Review of the above records demonstrates:  Please see elsewhere in the note.  ***   ASSESSMENT AND PLAN:  SOB:  ***   Current medicines are reviewed at length with the patient today.  The patient {ACTIONS; HAS/DOES NOT HAVE:19233} concerns regarding medicines.  The following changes have been made:  {PLAN; NO CHANGE:13088:s}  Labs/ tests ordered today include: *** No orders  of the defined types were placed in this encounter.    Disposition:   FU with ***    Signed, Rollene Rotunda, MD  06/21/2022 8:30 PM    [facility]

## 2022-06-22 LAB — BMP8+EGFR
BUN/Creatinine Ratio: 15 (ref 10–24)
BUN: 15 mg/dL (ref 8–27)
CO2: 28 mmol/L (ref 20–29)
Calcium: 9.3 mg/dL (ref 8.6–10.2)
Chloride: 102 mmol/L (ref 96–106)
Creatinine, Ser: 1.02 mg/dL (ref 0.76–1.27)
Glucose: 92 mg/dL (ref 70–99)
Potassium: 4.6 mmol/L (ref 3.5–5.2)
Sodium: 143 mmol/L (ref 134–144)
eGFR: 83 mL/min/{1.73_m2} (ref >59)

## 2022-06-22 LAB — VITAMIN B12: Vitamin B-12: 396 pg/mL (ref 232–1245)

## 2022-06-22 LAB — VITAMIN D 25 HYDROXY (VIT D DEFICIENCY, FRACTURES): Vit D, 25-Hydroxy: 18.3 ng/mL — ABNORMAL LOW (ref 30.0–100.0)

## 2022-06-22 LAB — TESTOSTERONE,FREE AND TOTAL
Testosterone, Free: 15.4 pg/mL (ref 6.6–18.1)
Testosterone: 261 ng/dL — ABNORMAL LOW (ref 264–916)

## 2022-06-22 LAB — FOLATE: Folate: 14.5 ng/mL (ref >3.0)

## 2022-06-23 ENCOUNTER — Ambulatory Visit: Payer: 59 | Admitting: Cardiology

## 2022-07-04 NOTE — Progress Notes (Signed)
Dear Jimmy Levine, Your Vitamin D is  low. You need a prescription strength supplement I will send that in for you.Testosterone is low. If replacement is desired, let me know.  Nurse, if at all possible, could you send in a prescription for the patient for vitamin D 50,000 units, 1 p.o. weekly #13 with 3 refills? Many thanks, WS

## 2022-07-06 ENCOUNTER — Other Ambulatory Visit: Payer: Self-pay

## 2022-07-06 ENCOUNTER — Telehealth: Payer: Self-pay | Admitting: Family Medicine

## 2022-07-06 DIAGNOSIS — Z1211 Encounter for screening for malignant neoplasm of colon: Secondary | ICD-10-CM | POA: Diagnosis not present

## 2022-07-06 NOTE — Telephone Encounter (Signed)
Please add orders for recheck of testosterone.

## 2022-07-06 NOTE — Telephone Encounter (Signed)
Labs have been added

## 2022-07-07 ENCOUNTER — Other Ambulatory Visit: Payer: 59

## 2022-07-07 ENCOUNTER — Other Ambulatory Visit: Payer: Self-pay | Admitting: Family Medicine

## 2022-07-07 ENCOUNTER — Telehealth: Payer: Self-pay | Admitting: Family Medicine

## 2022-07-07 MED ORDER — VITAMIN D (ERGOCALCIFEROL) 1.25 MG (50000 UNIT) PO CAPS: 50000.0000 [IU] | ORAL_CAPSULE | ORAL | 3 refills | Status: DC

## 2022-07-07 MED ORDER — SILDENAFIL CITRATE 20 MG PO TABS: ORAL_TABLET | ORAL | 5 refills | Status: DC

## 2022-07-07 NOTE — Telephone Encounter (Signed)
Please let the patient know that I sent their prescription to their pharmacy. Thanks, WS 

## 2022-07-08 LAB — TESTOSTERONE: Testosterone: 303 ng/dL (ref 264–916)

## 2022-07-10 ENCOUNTER — Telehealth: Payer: Self-pay | Admitting: Family Medicine

## 2022-07-10 ENCOUNTER — Other Ambulatory Visit: Payer: Self-pay | Admitting: Family Medicine

## 2022-07-10 MED ORDER — ANDROGEL 20.25 MG/1.25GM (1.62%) TD GEL: TRANSDERMAL | 5 refills | Status: DC

## 2022-07-10 NOTE — Telephone Encounter (Signed)
Please let the patient know that I sent their prescription to their pharmacy. Thanks, WS 

## 2022-07-10 NOTE — Telephone Encounter (Signed)
Patient calling to check on status of testosterone. Said that he spoke to nurse about it and it was supposed to be sent to [facility] in [place]

## 2022-07-10 NOTE — Telephone Encounter (Signed)
PATIENT PREFERS GEL

## 2022-07-10 NOTE — Telephone Encounter (Signed)
Did he want to take shots or apply gel?

## 2022-07-11 ENCOUNTER — Other Ambulatory Visit: Payer: Self-pay | Admitting: Family Medicine

## 2022-07-11 MED ORDER — TESTOSTERONE 20.25 MG/ACT (1.62%) TD GEL: 4.0000 | Freq: Every day | TRANSDERMAL | 1 refills | Status: DC

## 2022-07-11 NOTE — Telephone Encounter (Signed)
I sent itn the generic. However, since the second test was slightly higher, insurance may not cover.We can keep testing until it comes back low again.

## 2022-07-11 NOTE — Telephone Encounter (Signed)
Patient calling to let us know that insurance will not cover. Would like to know if generic can be called in.

## 2022-07-12 NOTE — Telephone Encounter (Signed)
Patient aware.

## 2022-07-13 DIAGNOSIS — I10 Essential (primary) hypertension: Secondary | ICD-10-CM | POA: Insufficient documentation

## 2022-07-13 DIAGNOSIS — R5382 Chronic fatigue, unspecified: Secondary | ICD-10-CM | POA: Insufficient documentation

## 2022-07-13 DIAGNOSIS — M7989 Other specified soft tissue disorders: Secondary | ICD-10-CM | POA: Insufficient documentation

## 2022-07-13 LAB — COLOGUARD: COLOGUARD: NEGATIVE

## 2022-07-13 NOTE — Progress Notes (Signed)
Cardiology Office Note   Date:  07/14/2022   ID:  Jimmy Levine, DOB 1961-02-17, MRN [id number]  PCP:  Claretta Fraise, MD  Cardiologist:   Kate Sable, MD (Inactive) Referring:  Claretta Fraise, MD  Chief Complaint  Patient presents with   Edema   PVCs      History of Present Illness: Jimmy Levine is a 62 y.o. male who presents for evaluation of SOB.  He is referred by Dr. Livia Snellen.  He was previously seen by Dr. Bronson Ing for evaluation of fatigue.  There was no clear etiology.  He had an echo.  This demonstrated no significant abnormalities.  He is referred back because he has had some dizziness.  He describes this happening when he was not taking his blood pressure medications.  His blood pressure was very elevated.  He had stopped taking it just because he did not want to take meds.  He was resumed back on the and a little diuretic because of some lower extremity swelling.  He thinks his dizziness is better.  He does get some dizziness but seems to be positional lying flat in the bed or turning his head too quickly.  He may have some mild orthostatic symptoms.  He was also noted to have some evidence of right ventricular conduction delay.  He did not describe presyncope or syncope.  He has not had any new chest pressure, neck or arm discomfort.  He had no new shortness of breath, PND or orthopnea.   Past Medical History:  Diagnosis Date   Arthritis    Carpal tunnel syndrome    GERD (gastroesophageal reflux disease)    Kidney stone    Sleep apnea    No CPAP, never tested    Past Surgical History:  Procedure Laterality Date   HAND TENOLYSIS     HERNIA REPAIR       Current Outpatient Medications  Medication Sig Dispense Refill   celecoxib (CELEBREX) 200 MG capsule Take 1 capsule (200 mg total) by mouth daily. For joints With food 30 capsule 5   furosemide (LASIX) 40 MG tablet Take 1 tablet (40 mg total) by mouth daily. 30 tablet 3   sildenafil  (REVATIO) 20 MG tablet Take 2-5 pills at once, orally, with each sexual encounter 50 tablet 5   Testosterone (ANDROGEL PUMP) 20.25 MG/ACT (1.62%) GEL Place 4 Pump onto the skin daily. 450 g 1   valsartan (DIOVAN) 160 MG tablet Take 1 tablet (160 mg total) by mouth daily. 90 tablet 3   Vitamin D, Ergocalciferol, (DRISDOL) 1.25 MG (50000 UNIT) CAPS capsule Take 1 capsule (50,000 Units total) by mouth every 7 (seven) days. 13 capsule 3   No current facility-administered medications for this visit.    Allergies:   Morphine and related    Social History:  The patient  reports that he has never smoked. He has never used smokeless tobacco. He reports that he does not currently use alcohol. He reports that he does not use drugs.   Family History:  The patient's family history includes Arthritis in his brother, father, and mother; COPD in his brother and mother; Heart attack (age of onset: 58) in his father; Hypertension in his mother.    ROS:  Please see the history of present illness.   Otherwise, review of systems are positive for none.   All other systems are reviewed and negative.    PHYSICAL EXAM: VS:  BP (!) 128/90   Pulse 75  Ht 6' (1.829 m)   Wt (!) 319 lb 9.6 oz (145 kg)   SpO2 96%   BMI 43.35 kg/m  , BMI Body mass index is 43.35 kg/m. GENERAL:  Well appearing HEENT:  Pupils equal round and reactive, fundi not visualized, oral mucosa unremarkable NECK:  No jugular venous distention, waveform within normal limits, carotid upstroke brisk and symmetric, no bruits, no thyromegaly LYMPHATICS:  No cervical, inguinal adenopathy LUNGS:  Clear to auscultation bilaterally BACK:  No CVA tenderness CHEST:  Unremarkable HEART:  PMI not displaced or sustained,S1 and S2 within normal limits, no S3, no S4, no clicks, no rubs, no murmurs ABD:  Flat, positive bowel sounds normal in frequency in pitch, no bruits, no rebound, no guarding, no midline pulsatile mass, no hepatomegaly, no  splenomegaly EXT:  2 plus pulses throughout, no edema, no cyanosis no clubbing SKIN:  No rashes no nodules NEURO:  Cranial nerves II through XII grossly intact, motor grossly intact throughout PSYCH:  Cognitively intact, oriented to person place and time    EKG:  EKG is ordered today. The ekg ordered today demonstrates sinus rhythm, rate 75, axis within normal limits, intervals within normal limits, premature ventricular contractions, no acute ST-T wave changes.   Recent Labs: 05/24/2022: Hemoglobin 16.3; Platelets 198; TSH 1.550 06/05/2022: ALT 29; BNP <2.5 06/19/2022: BUN 15; Creatinine, Ser 1.02; Potassium 4.6; Sodium 143    Lipid Panel    Component Value Date/Time   CHOL 170 03/26/2019 1638   TRIG 205 (H) 03/26/2019 1638   HDL 38 (L) 03/26/2019 1638   CHOLHDL 4.5 03/26/2019 1638   LDLCALC 97 03/26/2019 1638      Wt Readings from Last 3 Encounters:  07/14/22 (!) 319 lb 9.6 oz (145 kg)  06/19/22 (!) 321 lb 9.6 oz (145.9 kg)  06/05/22 (!) 317 lb (143.8 kg)      Other studies Reviewed: Additional studies/ records that were reviewed today include: Previous echocardiogram and primary care office records. Review of the above records demonstrates:  Please see elsewhere in the note.     ASSESSMENT AND PLAN:  Edema: I do not strongly suspect heart failure.  I suspect this is related to his weight.  It seems to be improved with the low-dose diuretic.  No change in therapy.  HTN: Blood pressure is controlled on the meds as listed.  No change in therapy.  Risk reduction: The patient will have a coronary calcium score given his family history of his dad having heart attack at an early age.  Goals of therapy for his lipids will be based on this.  Further testing will be based on this.  PVCs: The patient has ectopy and some dizziness.  I will apply a 3-day monitor but I do not suspect he will need likely further management of this as he is not really feeling the palpitations  unless he is having a high burden that might be contributing to his dizziness.  Current medicines are reviewed at length with the patient today.  The patient has concerns regarding medicines.  The following changes have been made:  no change  Labs/ tests ordered today include: None  Orders Placed This Encounter  Procedures   CT CARDIAC SCORING (SELF PAY ONLY)   EKG 12-Lead     Disposition:   FU with me as needed and based on the results of the above.   Signed, Minus Breeding, MD  07/14/2022 1:19 PM    [place]

## 2022-07-14 ENCOUNTER — Ambulatory Visit (INDEPENDENT_AMBULATORY_CARE_PROVIDER_SITE_OTHER): Payer: 59

## 2022-07-14 ENCOUNTER — Ambulatory Visit: Payer: 59 | Attending: Cardiology | Admitting: Cardiology

## 2022-07-14 ENCOUNTER — Encounter: Payer: Self-pay | Admitting: Cardiology

## 2022-07-14 ENCOUNTER — Other Ambulatory Visit: Payer: Self-pay | Admitting: Cardiology

## 2022-07-14 VITALS — BP 128/90 | HR 75 | Ht 72.0 in | Wt 319.6 lb

## 2022-07-14 DIAGNOSIS — I10 Essential (primary) hypertension: Secondary | ICD-10-CM | POA: Diagnosis not present

## 2022-07-14 DIAGNOSIS — M7989 Other specified soft tissue disorders: Secondary | ICD-10-CM

## 2022-07-14 DIAGNOSIS — R5382 Chronic fatigue, unspecified: Secondary | ICD-10-CM | POA: Diagnosis not present

## 2022-07-14 DIAGNOSIS — I493 Ventricular premature depolarization: Secondary | ICD-10-CM

## 2022-07-14 NOTE — Patient Instructions (Signed)
Medication Instructions:  NO CHANGES *If you need a refill on your cardiac medications before your next appointment, please call your pharmacy*   Lab Work: NONE If you have labs (blood work) drawn today and your tests are completely normal, you will receive your results only by: [place] (if you have MyChart) OR A paper copy in the mail If you have any lab test that is abnormal or we need to change your treatment, we will call you to review the results.   Testing/Procedures: CORONARY CALCIUM SCORING CT- AT [place]  Your physician has recommended that you wear a 3 DAY ZIO-PATCH monitor. The Zio patch cardiac monitor continuously records heart rhythm data for up to 14 days, this is for patients being evaluated for multiple types heart rhythms. For the first 24 hours post application, please avoid getting the Zio monitor wet in the shower or by excessive sweating during exercise. After that, feel free to carry on with regular activities. Keep soaps and lotions away from the ZIO XT Patch.  This will be mailed to you, please expect 7-10 days to receive.    Applying the monitor   Shave hair from upper left chest.   Hold abrader disc by orange tab.  Rub abrader in 40 strokes over left upper chest as indicated in your monitor instructions.   Clean area with 4 enclosed alcohol pads .  Use all pads to assure are is cleaned thoroughly.  Let dry.   Apply patch as indicated in monitor instructions.  Patch will be place under collarbone on left side of chest with arrow pointing upward.   Rub patch adhesive wings for 2 minutes.Remove white label marked "1".  Remove white label marked "2".  Rub patch adhesive wings for 2 additional minutes.   While looking in a mirror, press and release button in center of patch.  A small green light will flash 3-4 times .  This will be your only indicator the monitor has been turned on.     Do not shower for the first 24 hours.  You may  shower after the first 24 hours.   Press button if you feel a symptom. You will hear a small click.  Record Date, Time and Symptom in the Patient Log Book.   When you are ready to remove patch, follow instructions on last 2 pages of Patient Log Book.  Stick patch monitor onto last page of Patient Log Book.   Place Patient Log Book in [place] box.  Use locking tab on box and tape box closed securely.  The [employer] has [employer] on it.  Please place in mailbox as soon as possible.  Your physician should have your test results approximately 7 days after the monitor has been mailed back to [facility] [place].   Call [facility] at [phone] if you have questions regarding your ZIO XT patch monitor.  Call them immediately if you see an orange light blinking on your monitor.   If your monitor falls off in less than 4 days contact our Monitor department at [phone].  If your monitor becomes loose or falls off after 4 days call [employer] at [phone] for suggestions on securing your monitor    Follow-Up: At [facility], you and your health needs are our priority.  As part of our continuing mission to provide you with exceptional heart care, we have created designated Provider Care Teams.  These Care Teams include your primary Cardiologist (physician)  and Advanced Practice Providers (APPs -  Physician Assistants and Nurse Practitioners) who all work together to provide you with the care you need, when you need it.  We recommend signing up for the patient portal called "MyChart".  Sign up information is provided on this After Visit Summary.  MyChart is used to connect with patients for Virtual Visits (Telemedicine).  Patients are able to view lab/test results, encounter notes, upcoming appointments, etc.  Non-urgent messages can be sent to your provider as well.   To learn more about what you can do with MyChart, go to [email or web address].    Your next  appointment:   AS NEEDED ACCORDING TO RESULTS   Provider:   Dr Percival Spanish

## 2022-07-14 NOTE — Progress Notes (Unsigned)
Enrolled patient for a 3 day Zio XT monitor to be mailed to patients home  

## 2022-07-22 DIAGNOSIS — M7989 Other specified soft tissue disorders: Secondary | ICD-10-CM | POA: Diagnosis not present

## 2022-07-22 DIAGNOSIS — R5382 Chronic fatigue, unspecified: Secondary | ICD-10-CM | POA: Diagnosis not present

## 2022-07-22 DIAGNOSIS — I10 Essential (primary) hypertension: Secondary | ICD-10-CM

## 2022-07-22 DIAGNOSIS — I493 Ventricular premature depolarization: Secondary | ICD-10-CM | POA: Diagnosis not present

## 2022-07-31 DIAGNOSIS — R5382 Chronic fatigue, unspecified: Secondary | ICD-10-CM | POA: Diagnosis not present

## 2022-08-29 ENCOUNTER — Ambulatory Visit (HOSPITAL_COMMUNITY): Payer: 59

## 2022-09-30 DIAGNOSIS — M1990 Unspecified osteoarthritis, unspecified site: Secondary | ICD-10-CM | POA: Diagnosis not present

## 2022-09-30 DIAGNOSIS — E559 Vitamin D deficiency, unspecified: Secondary | ICD-10-CM | POA: Diagnosis not present

## 2022-09-30 DIAGNOSIS — Z8249 Family history of ischemic heart disease and other diseases of the circulatory system: Secondary | ICD-10-CM | POA: Diagnosis not present

## 2022-09-30 DIAGNOSIS — I10 Essential (primary) hypertension: Secondary | ICD-10-CM | POA: Diagnosis not present

## 2022-09-30 DIAGNOSIS — N529 Male erectile dysfunction, unspecified: Secondary | ICD-10-CM | POA: Diagnosis not present

## 2022-09-30 DIAGNOSIS — E230 Hypopituitarism: Secondary | ICD-10-CM | POA: Diagnosis not present

## 2022-09-30 DIAGNOSIS — Z6841 Body Mass Index (BMI) 40.0 and over, adult: Secondary | ICD-10-CM | POA: Diagnosis not present

## 2022-09-30 DIAGNOSIS — Z791 Long term (current) use of non-steroidal anti-inflammatories (NSAID): Secondary | ICD-10-CM | POA: Diagnosis not present

## 2022-09-30 DIAGNOSIS — R609 Edema, unspecified: Secondary | ICD-10-CM | POA: Diagnosis not present

## 2022-12-19 ENCOUNTER — Encounter: Payer: Self-pay | Admitting: Family Medicine

## 2022-12-19 ENCOUNTER — Ambulatory Visit (INDEPENDENT_AMBULATORY_CARE_PROVIDER_SITE_OTHER): Payer: 59 | Admitting: Family Medicine

## 2022-12-19 VITALS — BP 130/74 | HR 85 | Temp 97.9°F | Ht 72.0 in | Wt 330.4 lb

## 2022-12-19 DIAGNOSIS — Z13220 Encounter for screening for lipoid disorders: Secondary | ICD-10-CM | POA: Diagnosis not present

## 2022-12-19 DIAGNOSIS — I10 Essential (primary) hypertension: Secondary | ICD-10-CM | POA: Diagnosis not present

## 2022-12-19 DIAGNOSIS — E559 Vitamin D deficiency, unspecified: Secondary | ICD-10-CM

## 2022-12-19 DIAGNOSIS — R5383 Other fatigue: Secondary | ICD-10-CM

## 2022-12-19 DIAGNOSIS — R0683 Snoring: Secondary | ICD-10-CM

## 2022-12-19 DIAGNOSIS — E291 Testicular hypofunction: Secondary | ICD-10-CM

## 2022-12-19 MED ORDER — CELECOXIB 200 MG PO CAPS: 200.0000 mg | ORAL_CAPSULE | Freq: Every day | ORAL | 5 refills | Status: DC

## 2022-12-19 NOTE — Progress Notes (Signed)
Subjective:  Patient ID: Jimmy Levine, male    DOB: 1961-04-16  Age: 62 y.o. MRN: [id number]  CC: No chief complaint on file.   HPI Jimmy Levine presents for  presents for  follow-up of hypertension. Patient has no history of headache chest pain or shortness of breath or recent cough. Patient also denies symptoms of TIA such as focal numbness or weakness. Patient denies side effects from medication. States taking it regularly.  Pt. Taking testosterone cream from a friend. Using 2 pumps every third day. Feels stronger, but still tired.   Has been referred for sleep study since he awakens tired and has high probability from his body habitus. Previously it was denied, but since he has a lot of fatigue and awakens tired will try again. Wife says he snores.        06/19/2022    8:15 AM 06/19/2022    8:09 AM 06/05/2022    8:01 AM  Depression screen PHQ 2/9  Decreased Interest 0 0 0  Down, Depressed, Hopeless 0 0 0  PHQ - 2 Score 0 0 0  Altered sleeping 1    Tired, decreased energy 2    Change in appetite 0    Feeling bad or failure about yourself  0    Trouble concentrating 0    Moving slowly or fidgety/restless 0    Suicidal thoughts 0    PHQ-9 Score 3    Difficult doing work/chores Somewhat difficult      History [place] has a past medical history of Arthritis, Carpal tunnel syndrome, GERD (gastroesophageal reflux disease), Kidney stone, and Sleep apnea.   He has a past surgical history that includes Hernia repair and Hand Tenolysis.   His family history includes Arthritis in his brother, father, and mother; COPD in his brother and mother; Heart attack (age of onset: 74) in his father; Hypertension in his mother.He reports that he has never smoked. He has never used smokeless tobacco. He reports that he does not currently use alcohol. He reports that he does not use drugs.    ROS Review of Systems  Constitutional:  Negative for fever.  Respiratory:  Negative  for shortness of breath.   Cardiovascular:  Negative for chest pain.  Musculoskeletal:  Negative for arthralgias.  Skin:  Negative for rash.    Objective:  BP 130/74   Pulse 85   Temp 97.9 F (36.6 C)   Ht 6' (1.829 m)   Wt (!) 330 lb 6.4 oz (149.9 kg)   BMI 44.81 kg/m   BP Readings from Last 3 Encounters:  12/19/22 130/74  07/14/22 (!) 128/90  06/19/22 135/83    Wt Readings from Last 3 Encounters:  12/19/22 (!) 330 lb 6.4 oz (149.9 kg)  07/14/22 (!) 319 lb 9.6 oz (145 kg)  06/19/22 (!) 321 lb 9.6 oz (145.9 kg)     Physical Exam Vitals reviewed.  Constitutional:      Appearance: He is well-developed. He is obese.  HENT:     Head: Normocephalic and atraumatic.     Right Ear: External ear normal.     Left Ear: External ear normal.     Mouth/Throat:     Pharynx: No oropharyngeal exudate or posterior oropharyngeal erythema.  Eyes:     Pupils: Pupils are equal, round, and reactive to light.  Neck:     Comments: Neck is short and thick  Cardiovascular:     Rate and Rhythm: Normal rate and regular rhythm.  Heart sounds: No murmur heard. Pulmonary:     Effort: No respiratory distress.     Breath sounds: Normal breath sounds.  Musculoskeletal:     Cervical back: Normal range of motion and neck supple.  Neurological:     Mental Status: He is alert and oriented to person, place, and time.       Assessment & Plan:   Diagnoses and all orders for this visit:  Essential hypertension -     CBC with Differential/Platelet -     CMP14+EGFR -     Testosterone,Free and Total  Lipid screening -     Lipid panel  Snores -     Ambulatory referral to Sleep Studies  Hypogonadism in male  Vitamin D deficiency -     VITAMIN D 25 Hydroxy (Vit-D Deficiency, Fractures)  Fatigue, unspecified type -     TSH  Other orders -     celecoxib (CELEBREX) 200 MG capsule; Take 1 capsule (200 mg total) by mouth daily. For joints With food       I have discontinued  Jimmy Levine "[name]"'s furosemide and sildenafil. I am also having him maintain his valsartan, Vitamin D (Ergocalciferol), Testosterone, and celecoxib.  Allergies as of 12/19/2022       Reactions   Morphine And Codeine    Hot         Medication List        Accurate as of December 19, 2022  9:12 AM. If you have any questions, ask your nurse or doctor.          STOP taking these medications    furosemide 40 MG tablet Commonly known as: LASIX Stopped by: Mechele Claude, MD   sildenafil 20 MG tablet Commonly known as: REVATIO Stopped by: Mechele Claude, MD       TAKE these medications    celecoxib 200 MG capsule Commonly known as: CeleBREX Take 1 capsule (200 mg total) by mouth daily. For joints With food   Testosterone 20.25 MG/ACT (1.62%) Gel Commonly known as: AndroGel Pump Place 4 Pump onto the skin daily.   valsartan 160 MG tablet Commonly known as: DIOVAN Take 1 tablet (160 mg total) by mouth daily.   Vitamin D (Ergocalciferol) 1.25 MG (50000 UNIT) Caps capsule Commonly known as: DRISDOL Take 1 capsule (50,000 Units total) by mouth every 7 (seven) days.         Follow-up: Return in about 6 months (around 06/20/2023) for [facility] physical.  Mechele Claude, M.D.

## 2022-12-21 ENCOUNTER — Other Ambulatory Visit: Payer: Self-pay | Admitting: Family Medicine

## 2022-12-21 MED ORDER — TADALAFIL 20 MG PO TABS: 20.0000 mg | ORAL_TABLET | Freq: Every day | ORAL | 5 refills | Status: DC | PRN

## 2022-12-21 NOTE — Progress Notes (Signed)
Dear Jimmy Levine, Your Vitamin D is  low. You need a prescription strength supplement I will send that in for you. Nurse, if at all possible, could you send in a prescription for the patient for vitamin D 50,000 units, 1 p.o. weekly #13 with 3 refills? Many thanks, WS

## 2022-12-22 LAB — TSH: TSH: 1.63 u[IU]/mL (ref 0.450–4.500)

## 2022-12-22 LAB — LIPID PANEL
Chol/HDL Ratio: 3.8 ratio (ref 0.0–5.0)
Cholesterol, Total: 153 mg/dL (ref 100–199)
HDL: 40 mg/dL (ref >39)
LDL Chol Calc (NIH): 93 mg/dL (ref 0–99)
Triglycerides: 107 mg/dL (ref 0–149)
VLDL Cholesterol Cal: 20 mg/dL (ref 5–40)

## 2022-12-22 LAB — CBC WITH DIFFERENTIAL/PLATELET
Basophils Absolute: 0 10*3/uL (ref 0.0–0.2)
Basos: 1 %
EOS (ABSOLUTE): 0.1 10*3/uL (ref 0.0–0.4)
Eos: 2 %
Hematocrit: 46.2 % (ref 37.5–51.0)
Hemoglobin: 15.4 g/dL (ref 13.0–17.7)
Immature Grans (Abs): 0 10*3/uL (ref 0.0–0.1)
Immature Granulocytes: 0 %
Lymphocytes Absolute: 1.3 10*3/uL (ref 0.7–3.1)
Lymphs: 22 %
MCH: 30.9 pg (ref 26.6–33.0)
MCHC: 33.3 g/dL (ref 31.5–35.7)
MCV: 93 fL (ref 79–97)
Monocytes Absolute: 0.5 10*3/uL (ref 0.1–0.9)
Monocytes: 9 %
Neutrophils Absolute: 3.8 10*3/uL (ref 1.4–7.0)
Neutrophils: 66 %
Platelets: 198 10*3/uL (ref 150–450)
RBC: 4.98 x10E6/uL (ref 4.14–5.80)
RDW: 12.3 % (ref 11.6–15.4)
WBC: 5.7 10*3/uL (ref 3.4–10.8)

## 2022-12-22 LAB — CMP14+EGFR
ALT: 25 IU/L (ref 0–44)
AST: 23 IU/L (ref 0–40)
Albumin: 4.2 g/dL (ref 3.9–4.9)
Alkaline Phosphatase: 59 IU/L (ref 44–121)
BUN/Creatinine Ratio: 13 (ref 10–24)
BUN: 13 mg/dL (ref 8–27)
Bilirubin Total: 0.6 mg/dL (ref 0.0–1.2)
CO2: 27 mmol/L (ref 20–29)
Calcium: 9.5 mg/dL (ref 8.6–10.2)
Chloride: 104 mmol/L (ref 96–106)
Creatinine, Ser: 0.98 mg/dL (ref 0.76–1.27)
Globulin, Total: 2.1 g/dL (ref 1.5–4.5)
Glucose: 115 mg/dL — ABNORMAL HIGH (ref 70–99)
Potassium: 4.2 mmol/L (ref 3.5–5.2)
Sodium: 144 mmol/L (ref 134–144)
Total Protein: 6.3 g/dL (ref 6.0–8.5)
eGFR: 87 mL/min/{1.73_m2} (ref >59)

## 2022-12-22 LAB — VITAMIN D 25 HYDROXY (VIT D DEFICIENCY, FRACTURES): Vit D, 25-Hydroxy: 29.3 ng/mL — ABNORMAL LOW (ref 30.0–100.0)

## 2022-12-22 LAB — TESTOSTERONE,FREE AND TOTAL
Testosterone, Free: 15 pg/mL (ref 6.6–18.1)
Testosterone: 486 ng/dL (ref 264–916)

## 2022-12-25 ENCOUNTER — Other Ambulatory Visit: Payer: Self-pay | Admitting: Family Medicine

## 2022-12-25 MED ORDER — VITAMIN D (ERGOCALCIFEROL) 1.25 MG (50000 UNIT) PO CAPS: 50000.0000 [IU] | ORAL_CAPSULE | ORAL | 1 refills | Status: DC

## 2023-06-18 ENCOUNTER — Telehealth (INDEPENDENT_AMBULATORY_CARE_PROVIDER_SITE_OTHER): Payer: 59 | Admitting: Nurse Practitioner

## 2023-06-18 ENCOUNTER — Encounter: Payer: Self-pay | Admitting: Nurse Practitioner

## 2023-06-18 DIAGNOSIS — G8929 Other chronic pain: Secondary | ICD-10-CM | POA: Diagnosis not present

## 2023-06-18 DIAGNOSIS — R109 Unspecified abdominal pain: Secondary | ICD-10-CM

## 2023-06-18 NOTE — Progress Notes (Signed)
Virtual Visit via video Note Due to COVID-19 pandemic this visit was conducted virtually. This visit type was conducted due to national recommendations for restrictions regarding the COVID-19 Pandemic (e.g. social distancing, sheltering in place) in an effort to limit this patient's exposure and mitigate transmission in our community. All issues noted in this document were discussed and addressed.  A physical exam was not performed with this format.   I connected with Jimmy Levine on 06/18/2023 at 1255 by name DOB and verified that I am speaking with the correct person using two identifiers. Jimmy Levine is currently located at home and  is currently with them during visit. The provider, Martina Sinner, DNP is located in their office at time of visit.  I discussed the limitations, risks, security and privacy concerns of performing an evaluation and management service by virtual visit and the availability of in person appointments. I also discussed with the patient that there may be a patient responsible charge related to this service. The patient expressed understanding and agreed to proceed. [name] reports dealing nagging 2/10  Subjective:  Patient ID: Jimmy Levine, male    DOB: 12/09/1960, 62 y.o.   MRN: [id number]  Chief Complaint:  Flank Pain ("Right flank pain, just passed a stones and not having an appetite")   HPI: Jimmy Levine is a 62 y.o. male presenting on 06/18/2023 for Flank Pain ("Right flank pain, just passed a stones and not having an appetite") Client reports since fasting the kidney stones he has been experiencing and 19 2/10 pain on his right flank area open "I do not really know how to describe I don't want to call it pain, it just there". He also concerns about changes in his bowel habits " I went from twice daily to once a day" .  He also reports that he has been tired has not been the same since the last kidney stone and now  starting to gradually improve.  He reported he had old male with some foods this morning and still does not have an appetite "when I eat I can hear gurgling sound from my abdomen, report issue is because I am not getting enough".  Client has an upcoming appointment with his PCP on Wednesday.  Client denied nausea or vomiting, abdominal pain, shortness of breath, chest pain, headache. Advised client to come in so he can provide a urine sample to rule out possible kidney stone.  He wants to wait until he sees his PCP on Wednesday, June 20, 2023  Relevant past medical, surgical, family, and social history reviewed and updated as indicated.  Allergies and medications reviewed and updated.   Past Medical History:  Diagnosis Date   Arthritis    Carpal tunnel syndrome    GERD (gastroesophageal reflux disease)    Kidney stone    Sleep apnea    No CPAP, never tested    Past Surgical History:  Procedure Laterality Date   HAND TENOLYSIS     HERNIA REPAIR      Social History   Socioeconomic History   Marital status: Married    Spouse name: Not on file   Number of children: Not on file   Years of education: Not on file   Highest education level: Not on file  Occupational History   Occupation: builder/welder  Tobacco Use   Smoking status: Never   Smokeless tobacco: Never  Vaping Use   Vaping status: Never Used  Substance and Sexual Activity   Alcohol use: Not Currently    Comment: 10 yrs ago   Drug use: Never   Sexual activity: Yes    Birth control/protection: None  Other Topics Concern   Not on file  Social History Narrative   Not on file   Social Drivers of Health   Financial Resource Strain: Not on file  Food Insecurity: Not on file  Transportation Needs: Not on file  Physical Activity: Not on file  Stress: No Stress Concern Present (05/28/2018)   [employer] of Occupational Health - Occupational Stress Questionnaire    Feeling of Stress : Only a little   Social Connections: Not on file  Intimate Partner Violence: Not on file    Outpatient Encounter Medications as of 06/18/2023  Medication Sig   celecoxib (CELEBREX) 200 MG capsule Take 1 capsule (200 mg total) by mouth daily. For joints With food   tadalafil (CIALIS) 20 MG tablet Take 1 tablet (20 mg total) by mouth daily as needed for erectile dysfunction.   Testosterone (ANDROGEL PUMP) 20.25 MG/ACT (1.62%) GEL Place 4 Pump onto the skin daily.   valsartan (DIOVAN) 160 MG tablet Take 1 tablet (160 mg total) by mouth daily.   Vitamin D, Ergocalciferol, (DRISDOL) 1.25 MG (50000 UNIT) CAPS capsule Take 1 capsule (50,000 Units total) by mouth 2 (two) times a week.   No facility-administered encounter medications on file as of 06/18/2023.    Allergies  Allergen Reactions   Morphine And Codeine     Hot     Review of Systems  Constitutional:  Negative for appetite change, fatigue and fever.  HENT:  Negative for congestion and sinus pain.   Respiratory:  Negative for chest tightness and shortness of breath.   Cardiovascular:  Negative for chest pain and leg swelling.  Gastrointestinal:  Negative for abdominal pain, blood in stool, diarrhea and nausea.  Genitourinary:  Positive for flank pain. Negative for decreased urine volume and urgency.       Right nagging pain 2/10  Skin:  Negative for rash.  Neurological:  Negative for dizziness.         Observations/Objective: No vital signs or physical exam, this was a virtual health encounter.  Pt alert and oriented, answers all questions appropriately, and able to speak in full sentences.    Assessment and Plan: Jimmy Levine "Jimmy Levine" was seen today for flank pain.  Diagnoses and all orders for this visit:  Chronic right flank pain  Jimmy Levine 62 year old male seen in today via telehealth for right flank pain. Advised client on eating small meal throughout the day Will follow-up with PCP to rule out possible kidney stone Increase  hydration Follow Up Instructions: Return for as already scheduled with PCP on 12.    I discussed the assessment and treatment plan with the patient. The patient was provided an opportunity to ask questions and all were answered. The patient agreed with the plan and demonstrated an understanding of the instructions.   The patient was advised to call back or seek an in-person evaluation if the symptoms worsen or if the condition fails to improve as anticipated.  The above assessment and management plan was discussed with the patient. The patient verbalized understanding of and has agreed to the management plan. Patient is aware to call the clinic if they develop any new symptoms or if symptoms persist or worsen. Patient is aware when to return to the clinic for a follow-up visit. Patient educated on when it is  appropriate to go to the emergency department.    I provided 10 minutes of time during this video encounter.   Arrie Aran Santa Lighter, DNP [facility] [address] [place], [place] [address] [phone] 06/18/2023

## 2023-06-20 ENCOUNTER — Encounter: Payer: Self-pay | Admitting: Family Medicine

## 2023-06-20 ENCOUNTER — Ambulatory Visit (INDEPENDENT_AMBULATORY_CARE_PROVIDER_SITE_OTHER): Payer: 59 | Admitting: Family Medicine

## 2023-06-20 VITALS — BP 159/100 | HR 86 | Temp 97.9°F | Ht 72.0 in | Wt 323.4 lb

## 2023-06-20 DIAGNOSIS — E559 Vitamin D deficiency, unspecified: Secondary | ICD-10-CM | POA: Diagnosis not present

## 2023-06-20 DIAGNOSIS — N12 Tubulo-interstitial nephritis, not specified as acute or chronic: Secondary | ICD-10-CM | POA: Diagnosis not present

## 2023-06-20 DIAGNOSIS — E291 Testicular hypofunction: Secondary | ICD-10-CM | POA: Diagnosis not present

## 2023-06-20 DIAGNOSIS — J329 Chronic sinusitis, unspecified: Secondary | ICD-10-CM

## 2023-06-20 DIAGNOSIS — Z0001 Encounter for general adult medical examination with abnormal findings: Secondary | ICD-10-CM

## 2023-06-20 DIAGNOSIS — Z125 Encounter for screening for malignant neoplasm of prostate: Secondary | ICD-10-CM

## 2023-06-20 DIAGNOSIS — Z0000 Encounter for general adult medical examination without abnormal findings: Secondary | ICD-10-CM

## 2023-06-20 DIAGNOSIS — R300 Dysuria: Secondary | ICD-10-CM

## 2023-06-20 DIAGNOSIS — Z13220 Encounter for screening for lipoid disorders: Secondary | ICD-10-CM

## 2023-06-20 DIAGNOSIS — I10 Essential (primary) hypertension: Secondary | ICD-10-CM

## 2023-06-20 LAB — MICROSCOPIC EXAMINATION
Renal Epithel, UA: NONE SEEN /[HPF]
Yeast, UA: NONE SEEN

## 2023-06-20 LAB — URINALYSIS, COMPLETE
Bilirubin, UA: NEGATIVE
Glucose, UA: NEGATIVE
Ketones, UA: NEGATIVE
Nitrite, UA: NEGATIVE
Protein,UA: NEGATIVE
Specific Gravity, UA: 1.015 (ref 1.005–1.030)
Urobilinogen, Ur: 0.2 mg/dL (ref 0.2–1.0)
pH, UA: 5.5 (ref 5.0–7.5)

## 2023-06-20 MED ORDER — LEVOFLOXACIN 500 MG PO TABS
500.0000 mg | ORAL_TABLET | Freq: Every day | ORAL | 0 refills | Status: DC
Start: 2023-06-20 — End: 2023-09-17

## 2023-06-20 MED ORDER — FEXOFENADINE HCL 180 MG PO TABS
180.0000 mg | ORAL_TABLET | Freq: Every day | ORAL | 11 refills | Status: DC
Start: 2023-06-20 — End: 2023-12-24

## 2023-06-20 NOTE — Progress Notes (Signed)
Subjective:  Patient ID: Jimmy Levine, male    DOB: 22-Jun-1961  Age: 62 y.o. MRN: [id number]  CC: Annual Exam   HPI Jimmy Levine presents for CPE Having swelling in legs and scrotum. Off fluid med due to climb creatinine   Also here for evaluation of recent kidney stone. Lots of bilateral flank pain 2 weeks ago. Then heard a "plop" when he assumed he had passed it. Then continued with right flank pain and poor appetite until yesterday. Pain has remitted some.      06/20/2023    8:08 AM 06/19/2022    8:15 AM 06/19/2022    8:09 AM  Depression screen PHQ 2/9  Decreased Interest 0 0 0  Down, Depressed, Hopeless 0 0 0  PHQ - 2 Score 0 0 0  Altered sleeping  1   Tired, decreased energy  2   Change in appetite  0   Feeling bad or failure about yourself   0   Trouble concentrating  0   Moving slowly or fidgety/restless  0   Suicidal thoughts  0   PHQ-9 Score  3   Difficult doing work/chores  Somewhat difficult     History Jimmy Levine has a past medical history of Arthritis, Carpal tunnel syndrome, GERD (gastroesophageal reflux disease), Kidney stone, and Sleep apnea.   He has a past surgical history that includes Hernia repair and Hand Tenolysis.   His family history includes Arthritis in his brother, father, and mother; COPD in his brother and mother; Heart attack (age of onset: 48) in his father; Hypertension in his mother.He reports that he has never smoked. He has never used smokeless tobacco. He reports that he does not currently use alcohol. He reports that he does not use drugs.    ROS Review of Systems  Constitutional:  Negative for activity change, fatigue, fever and unexpected weight change.  HENT:  Positive for congestion and postnasal drip (AM has to hack it up). Negative for ear pain, hearing loss and trouble swallowing.   Eyes:  Negative for pain and visual disturbance.  Respiratory:  Negative for cough, chest tightness and shortness of breath.    Cardiovascular:  Positive for leg swelling. Negative for chest pain and palpitations.  Gastrointestinal:  Negative for abdominal distention, abdominal pain, blood in stool, constipation, diarrhea, nausea and vomiting.  Endocrine: Negative for cold intolerance, heat intolerance and polydipsia.  Genitourinary:  Positive for flank pain. Negative for difficulty urinating, dysuria, frequency and urgency.  Musculoskeletal:  Negative for arthralgias and joint swelling.  Skin:  Negative for color change, rash and wound.  Neurological:  Negative for dizziness, syncope, speech difficulty, weakness, light-headedness, numbness and headaches.  Hematological:  Does not bruise/bleed easily.  Psychiatric/Behavioral:  Negative for confusion, decreased concentration, dysphoric mood and sleep disturbance. The patient is not nervous/anxious.     Objective:  BP (!) 159/100   Pulse 86   Temp 97.9 F (36.6 C)   Ht 6' (1.829 m)   Wt (!) 323 lb 6.4 oz (146.7 kg)   SpO2 92%   BMI 43.86 kg/m   BP Readings from Last 3 Encounters:  06/20/23 (!) 159/100  12/19/22 130/74  07/14/22 (!) 128/90    Wt Readings from Last 3 Encounters:  06/20/23 (!) 323 lb 6.4 oz (146.7 kg)  12/19/22 (!) 330 lb 6.4 oz (149.9 kg)  07/14/22 (!) 319 lb 9.6 oz (145 kg)     Physical Exam Constitutional:      Appearance: He is  well-developed. He is obese.  HENT:     Head: Normocephalic and atraumatic.  Eyes:     Pupils: Pupils are equal, round, and reactive to light.  Neck:     Thyroid: No thyromegaly.     Trachea: No tracheal deviation.  Cardiovascular:     Rate and Rhythm: Normal rate and regular rhythm.     Heart sounds: Normal heart sounds. No murmur heard.    No friction rub. No gallop.  Pulmonary:     Breath sounds: Normal breath sounds. No wheezing or rales.  Abdominal:     General: Bowel sounds are normal. There is no distension.     Palpations: Abdomen is soft. There is no mass.     Tenderness: There is no  abdominal tenderness.     Hernia: There is no hernia in the left inguinal area.  Genitourinary:    Penis: Normal.      Testes: Normal.  Musculoskeletal:        General: Normal range of motion.     Cervical back: Normal range of motion.  Lymphadenopathy:     Cervical: No cervical adenopathy.  Skin:    General: Skin is warm and dry.  Neurological:     Mental Status: He is alert and oriented to person, place, and time.       Assessment & Plan:   Jimmy Levine" was seen today for annual exam.  Diagnoses and all orders for this visit:  Well adult exam -     CBC with Differential/Platelet -     CMP14+EGFR -     Lipid panel -     Testosterone,Free and Total -     PSA, total and free -     Urinalysis, Complete  Lipid screening -     Lipid panel  Essential hypertension -     CBC with Differential/Platelet -     CMP14+EGFR  Hypogonadism in male -     Testosterone,Free and Total  Vitamin D deficiency -     VITAMIN D 25 Hydroxy (Vit-D Deficiency, Fractures)  Prostate cancer screening -     PSA, total and free  Dysuria -     Urinalysis, Complete -     levofloxacin (LEVAQUIN) 500 MG tablet; Take 1 tablet (500 mg total) by mouth daily.  Morbid obesity (HCC)  Pyelonephritis -     Urinalysis, Complete -     levofloxacin (LEVAQUIN) 500 MG tablet; Take 1 tablet (500 mg total) by mouth daily.  Chronic sinusitis, unspecified location  Other orders -     fexofenadine (ALLEGRA) 180 MG tablet; Take 1 tablet (180 mg total) by mouth daily. For allergy symptoms       I am having Jimmy Levine "Jimmy Levine" start on levofloxacin and fexofenadine. I am also having him maintain his valsartan, Testosterone, celecoxib, tadalafil, and Vitamin D (Ergocalciferol).  Allergies as of 06/20/2023       Reactions   Morphine And Codeine    Hot         Medication List        Accurate as of June 20, 2023  9:08 AM. If you have any questions, ask your nurse or doctor.           celecoxib 200 MG capsule Commonly known as: CeleBREX Take 1 capsule (200 mg total) by mouth daily. For joints With food   fexofenadine 180 MG tablet Commonly known as: ALLEGRA Take 1 tablet (180 mg total) by mouth daily. For allergy  symptoms Started by: Tasman Zapata   levofloxacin 500 MG tablet Commonly known as: LEVAQUIN Take 1 tablet (500 mg total) by mouth daily. Started by: Eltha Tingley   tadalafil 20 MG tablet Commonly known as: CIALIS Take 1 tablet (20 mg total) by mouth daily as needed for erectile dysfunction.   Testosterone 20.25 MG/ACT (1.62%) Gel Commonly known as: AndroGel Pump Place 4 Pump onto the skin daily.   valsartan 160 MG tablet Commonly known as: DIOVAN Take 1 tablet (160 mg total) by mouth daily.   Vitamin D (Ergocalciferol) 1.25 MG (50000 UNIT) Caps capsule Commonly known as: DRISDOL Take 1 capsule (50,000 Units total) by mouth 2 (two) times a week.         Follow-up: No follow-ups on file.  Mechele Claude, M.D.

## 2023-06-28 ENCOUNTER — Other Ambulatory Visit: Payer: Self-pay | Admitting: Family Medicine

## 2023-06-28 NOTE — Telephone Encounter (Signed)
Copied from CRM [id number]. Topic: Clinical - Medication Refill >> Jun 28, 2023 12:53 PM Elle L wrote: Most Recent Primary Care Visit:  Provider: Mechele Claude  Department: [facility]  Visit Type: PHYSICAL  Date: 06/20/2023  Medication: ***  Has the patient contacted their pharmacy?  (Agent: If no, request that the patient contact the pharmacy for the refill. If patient does not wish to contact the pharmacy document the reason why and proceed with request.) (Agent: If yes, when and what did the pharmacy advise?)  Is this the correct pharmacy for this prescription?  If no, delete pharmacy and type the correct one.  This is the patient's preferred pharmacy:  [facility] [address], [place] - [address] [place] [address] [place] [address] Phone: [phone] Fax: [phone]   Has the prescription been filled recently?   Is the patient out of the medication?   Has the patient been seen for an appointment in the last year OR does the patient have an upcoming appointment?   Can we respond through MyChart?   Agent: Please be advised that Rx refills may take up to 3 business days. We ask that you follow-up with your pharmacy.

## 2023-06-28 NOTE — Telephone Encounter (Signed)
Copied from CRM [id number]. Topic: Clinical - Medication Refill >> Jun 28, 2023 12:53 PM Elle L wrote: Most Recent Primary Care Visit:  Provider: Mechele Claude  Department: [facility]  Visit Type: PHYSICAL  Date: 06/20/2023  Medication: valsartan (DIOVAN) 160 MG tablet  Has the patient contacted their pharmacy? Yes  Is this the correct pharmacy for this prescription? Yes If no, delete pharmacy and type the correct one.  This is the patient's preferred pharmacy:  [facility] [address] [place] - [address] [place] [address] [place] [address] Phone: [phone] Fax: [phone]   Has the prescription been filled recently? Yes  Is the patient out of the medication? Yes  Has the patient been seen for an appointment in the last year OR does the patient have an upcoming appointment? Yes  Can we respond through MyChart? Yes  Agent: Please be advised that Rx refills may take up to 3 business days. We ask that you follow-up with your pharmacy.

## 2023-09-17 ENCOUNTER — Encounter: Payer: Self-pay | Admitting: Family Medicine

## 2023-09-17 ENCOUNTER — Ambulatory Visit (INDEPENDENT_AMBULATORY_CARE_PROVIDER_SITE_OTHER)

## 2023-09-17 ENCOUNTER — Ambulatory Visit (INDEPENDENT_AMBULATORY_CARE_PROVIDER_SITE_OTHER): Admitting: Family Medicine

## 2023-09-17 VITALS — BP 145/82 | HR 94 | Temp 98.2°F | Ht <= 58 in | Wt 325.0 lb

## 2023-09-17 DIAGNOSIS — M25562 Pain in left knee: Secondary | ICD-10-CM

## 2023-09-17 DIAGNOSIS — Z13220 Encounter for screening for lipoid disorders: Secondary | ICD-10-CM | POA: Diagnosis not present

## 2023-09-17 DIAGNOSIS — Z125 Encounter for screening for malignant neoplasm of prostate: Secondary | ICD-10-CM | POA: Diagnosis not present

## 2023-09-17 DIAGNOSIS — E559 Vitamin D deficiency, unspecified: Secondary | ICD-10-CM | POA: Diagnosis not present

## 2023-09-17 DIAGNOSIS — E291 Testicular hypofunction: Secondary | ICD-10-CM | POA: Diagnosis not present

## 2023-09-17 DIAGNOSIS — I10 Essential (primary) hypertension: Secondary | ICD-10-CM | POA: Diagnosis not present

## 2023-09-17 DIAGNOSIS — M25462 Effusion, left knee: Secondary | ICD-10-CM | POA: Diagnosis not present

## 2023-09-17 DIAGNOSIS — Z0000 Encounter for general adult medical examination without abnormal findings: Secondary | ICD-10-CM | POA: Diagnosis not present

## 2023-09-17 MED ORDER — CELECOXIB 400 MG PO CAPS: 400.0000 mg | ORAL_CAPSULE | Freq: Every day | ORAL | 1 refills | Status: DC

## 2023-09-17 NOTE — Progress Notes (Signed)
 Subjective:  Patient ID: Jimmy Levine, male    DOB: [birth year]  Age: 63 y.o. MRN: [id number]  CC: Knee Pain (Two weeks of left knee pain. Started in the front but now hurting behind the knee. Walking on rocks and going up a lot of steps at work. Edema present before the pain. Popping and clicking. Numbness in leg and foot. Started to have sciatic pain on that side as well. )   HPI Jimmy Levine presents for onset of knee pain 2 weeks ago.  It is primarily on the left.  However he is having over the left and is causing some pain in the right now.  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   as well as having to walk up steps a lot.     09/17/2023   10:41 AM 09/17/2023   10:39 AM 06/20/2023    8:08 AM  Depression screen PHQ 2/9  Decreased Interest 0  0  Down, Depressed, Hopeless 0 0 0  PHQ - 2 Score 0 0 0  Altered sleeping 1 1   Tired, decreased energy 1 1   Change in appetite 1 1   Feeling bad or failure about yourself  0 0   Trouble concentrating 0 0   Moving slowly or fidgety/restless 0 0   Suicidal thoughts 0 0   PHQ-9 Score 3 3   Difficult doing work/chores Not difficult at all Not difficult at all     History Jimmy Levine has a past medical history of Arthritis, Carpal tunnel syndrome, GERD (gastroesophageal reflux disease), Kidney stone, and Sleep apnea.   He has a past surgical history that includes Hernia repair and Hand Tenolysis.   His family history includes Arthritis in his brother, father, and mother; COPD in his brother and mother; Heart attack (age of onset: 78) in his father; Hypertension in his mother.He reports that he has never smoked. He has never used smokeless tobacco. He reports that he does not currently use alcohol. He reports that he does not use drugs.    ROS Review of Systems  Constitutional:  Negative for fever.  Respiratory:  Negative for shortness of breath.    Cardiovascular:  Negative for chest pain.  Musculoskeletal:  Negative for arthralgias.  Skin:  Negative for rash.    Objective:  BP (!) 145/82   Pulse 94   Temp 98.2 F (36.8 C)   Ht 2' (0.61 m)   Wt (!) 325 lb (147.4 kg)   SpO2 96%   BMI 396.70 kg/m   BP Readings from Last 3 Encounters:  09/17/23 (!) 145/82  06/20/23 (!) 159/100  12/19/22 130/74    Wt Readings from Last 3 Encounters:  09/17/23 (!) 325 lb (147.4 kg)  06/20/23 (!) 323 lb 6.4 oz (146.7 kg)  12/19/22 (!) 330 lb 6.4 oz (149.9 kg)     Physical Exam Vitals reviewed.  Constitutional:      Appearance: He is well-developed.  HENT:     Head: Normocephalic and atraumatic.     Right Ear: External ear normal.     Left Ear: External ear normal.     Mouth/Throat:     Pharynx: No oropharyngeal exudate or posterior oropharyngeal erythema.  Eyes:     Pupils: Pupils are equal, round, and reactive to light.  Cardiovascular:     Rate and Rhythm: Normal rate and regular rhythm.     Heart sounds: No murmur heard.  Pulmonary:     Effort: No respiratory distress.     Breath sounds: Normal breath sounds.  Musculoskeletal:     Cervical back: Normal range of motion and neck supple.  Neurological:     Mental Status: He is alert and oriented to person, place, and time.       Assessment & Plan:   Jimmy Levine" was seen today for knee pain.  Diagnoses and all orders for this visit:  Acute pain of left knee -     DG Knee 1-2 Views Left; Future  Morbid obesity (HCC)  Other orders -     celecoxib (CELEBREX) 400 MG capsule; Take 1 capsule (400 mg total) by mouth daily. For joints With food     After obtaining informed consent, A steroid injection was performed at the left knee. The medial compartment was cleansed with betadine. With 1.5 inch, 23 gauge needle using 2.5 ml marcan and 9 mg of Celestone the joint space was entered and injected. This was well tolerated. Simple dressing applied.   .   I have  discontinued Jimmy Levine "[name]"'s levofloxacin. I have also changed his celecoxib. Additionally, I am having him maintain his Testosterone, tadalafil, Vitamin D (Ergocalciferol), fexofenadine, and valsartan.  Allergies as of 09/17/2023       Reactions   Morphine And Codeine    Hot         Medication List        Accurate as of September 17, 2023  5:25 PM. If you have any questions, ask your nurse or doctor.          STOP taking these medications    levofloxacin 500 MG tablet Commonly known as: LEVAQUIN Stopped by: Shaelin Lalley       TAKE these medications    celecoxib 400 MG capsule Commonly known as: CeleBREX Take 1 capsule (400 mg total) by mouth daily. For joints With food What changed:  medication strength how much to take Changed by: Tayler Lassen   fexofenadine 180 MG tablet Commonly known as: ALLEGRA Take 1 tablet (180 mg total) by mouth daily. For allergy symptoms   tadalafil 20 MG tablet Commonly known as: CIALIS Take 1 tablet (20 mg total) by mouth daily as needed for erectile dysfunction.   Testosterone 20.25 MG/ACT (1.62%) Gel Commonly known as: AndroGel Pump Place 4 Pump onto the skin daily.   valsartan 160 MG tablet Commonly known as: DIOVAN Take 1 tablet by mouth once daily   Vitamin D (Ergocalciferol) 1.25 MG (50000 UNIT) Caps capsule Commonly known as: DRISDOL Take 1 capsule (50,000 Units total) by mouth 2 (two) times a week.         Follow-up: Return if symptoms worsen or fail to improve.  Mechele Claude, M.D.

## 2023-09-18 ENCOUNTER — Encounter: Payer: Self-pay | Admitting: Family Medicine

## 2023-09-18 ENCOUNTER — Other Ambulatory Visit: Payer: Self-pay | Admitting: Family Medicine

## 2023-09-18 ENCOUNTER — Telehealth: Payer: Self-pay | Admitting: Family Medicine

## 2023-09-18 MED ORDER — VITAMIN D (ERGOCALCIFEROL) 1.25 MG (50000 UNIT) PO CAPS: 50000.0000 [IU] | ORAL_CAPSULE | ORAL | 1 refills | Status: DC

## 2023-09-18 MED ORDER — CELECOXIB 200 MG PO CAPS
400.0000 mg | ORAL_CAPSULE | Freq: Every day | ORAL | 3 refills | Status: DC
Start: 2023-09-18 — End: 2023-12-24

## 2023-09-18 NOTE — Telephone Encounter (Signed)
 Copied from CRM [id number]. Topic: Clinical - Medication Refill >> Sep 18, 2023 10:18 AM Emylou G wrote: Patent called said pharmacy rejected celecoxib (CELEBREX) 400 MG capsule said due to insurance??  Please advise.. In the meanwhile can he double his 200mg  til the issue is resolved.. pls call him back [phone]

## 2023-09-18 NOTE — Addendum Note (Signed)
 Addended by: Austin Miles F on: 09/18/2023 11:07 AM   Modules accepted: Orders

## 2023-09-18 NOTE — Telephone Encounter (Signed)
 Okay to take 2 X 200 mg daily. I sent that to pharmacy. May have to go to prior auth.

## 2023-09-18 NOTE — Progress Notes (Signed)
Dear [name], Your Vitamin D is  low. You need a prescription strength supplement I will send that in for you. Nurse, if at all possible, could you send in a prescription for the patient for vitamin D 50,000 units, 1 p.o. weekly #13 with 3 refills? Many thanks, WS

## 2023-09-19 ENCOUNTER — Other Ambulatory Visit (HOSPITAL_COMMUNITY): Payer: Self-pay

## 2023-09-19 ENCOUNTER — Telehealth: Payer: Self-pay

## 2023-09-19 NOTE — Telephone Encounter (Signed)
 Pharmacy Patient Advocate Encounter   Received notification from Pt Calls Messages that prior authorization for Celebrex 400 is required/requested.   Insurance verification completed.   The patient is insured through [employer] .   Per test claim: Refill too soon. PA is not needed at this time. Medication was filled 09/18/23. Next eligible fill date is 09/30/23.

## 2023-09-19 NOTE — Telephone Encounter (Signed)
 Pharmacy Patient Advocate Encounter   Received notification from Pt Calls Messages that prior authorization for Celebrex 400 is required/requested.   Insurance verification completed.   The patient is insured through [employer] .   Per test claim:  Celebrex 200 is preferred by the insurance.  If suggested medication is appropriate, Please send in a new RX and discontinue this one. If not, please advise as to why it's not appropriate so that we may request a Prior Authorization. Please note, some preferred medications may still require a PA.  If the suggested medications have not been trialed and there are no contraindications to their use, the PA will not be submitted, as it will not be approved.

## 2023-09-20 ENCOUNTER — Other Ambulatory Visit (HOSPITAL_COMMUNITY): Payer: Self-pay

## 2023-09-22 LAB — PSA, TOTAL AND FREE
PSA, Free Pct: 25.7 %
PSA, Free: 0.72 ng/mL
Prostate Specific Ag, Serum: 2.8 ng/mL (ref 0.0–4.0)

## 2023-09-22 LAB — CBC WITH DIFFERENTIAL/PLATELET
Basophils Absolute: 0 10*3/uL (ref 0.0–0.2)
Basos: 1 %
EOS (ABSOLUTE): 0.1 10*3/uL (ref 0.0–0.4)
Eos: 2 %
Hematocrit: 49.8 % (ref 37.5–51.0)
Hemoglobin: 15.8 g/dL (ref 13.0–17.7)
Immature Grans (Abs): 0 10*3/uL (ref 0.0–0.1)
Immature Granulocytes: 0 %
Lymphocytes Absolute: 1.3 10*3/uL (ref 0.7–3.1)
Lymphs: 25 %
MCH: 29.8 pg (ref 26.6–33.0)
MCHC: 31.7 g/dL (ref 31.5–35.7)
MCV: 94 fL (ref 79–97)
Monocytes Absolute: 0.6 10*3/uL (ref 0.1–0.9)
Monocytes: 12 %
Neutrophils Absolute: 3.1 10*3/uL (ref 1.4–7.0)
Neutrophils: 60 %
Platelets: 204 10*3/uL (ref 150–450)
RBC: 5.3 x10E6/uL (ref 4.14–5.80)
RDW: 12.8 % (ref 11.6–15.4)
WBC: 5.2 10*3/uL (ref 3.4–10.8)

## 2023-09-22 LAB — CMP14+EGFR
ALT: 29 IU/L (ref 0–44)
AST: 18 IU/L (ref 0–40)
Albumin: 3.9 g/dL (ref 3.9–4.9)
Alkaline Phosphatase: 72 IU/L (ref 44–121)
BUN/Creatinine Ratio: 16 (ref 10–24)
BUN: 17 mg/dL (ref 8–27)
Bilirubin Total: 0.5 mg/dL (ref 0.0–1.2)
CO2: 28 mmol/L (ref 20–29)
Calcium: 9.1 mg/dL (ref 8.6–10.2)
Chloride: 103 mmol/L (ref 96–106)
Creatinine, Ser: 1.08 mg/dL (ref 0.76–1.27)
Globulin, Total: 2.5 g/dL (ref 1.5–4.5)
Glucose: 82 mg/dL (ref 70–99)
Potassium: 4.4 mmol/L (ref 3.5–5.2)
Sodium: 142 mmol/L (ref 134–144)
Total Protein: 6.4 g/dL (ref 6.0–8.5)
eGFR: 77 mL/min/{1.73_m2} (ref >59)

## 2023-09-22 LAB — TESTOSTERONE,FREE AND TOTAL
Testosterone, Free: 11.2 pg/mL (ref 6.6–18.1)
Testosterone: 444 ng/dL (ref 264–916)

## 2023-09-22 LAB — LIPID PANEL
Chol/HDL Ratio: 4.4 ratio (ref 0.0–5.0)
Cholesterol, Total: 149 mg/dL (ref 100–199)
HDL: 34 mg/dL — ABNORMAL LOW (ref >39)
LDL Chol Calc (NIH): 84 mg/dL (ref 0–99)
Triglycerides: 178 mg/dL — ABNORMAL HIGH (ref 0–149)
VLDL Cholesterol Cal: 31 mg/dL (ref 5–40)

## 2023-09-22 LAB — VITAMIN D 25 HYDROXY (VIT D DEFICIENCY, FRACTURES): Vit D, 25-Hydroxy: 27.7 ng/mL — ABNORMAL LOW (ref 30.0–100.0)

## 2023-10-12 ENCOUNTER — Other Ambulatory Visit: Payer: Self-pay | Admitting: Family Medicine

## 2023-10-12 NOTE — Telephone Encounter (Signed)
 Copied from CRM [id number]. Topic: Clinical - Medication Refill >> Oct 12, 2023 10:41 AM Clayton Bibles wrote: Most Recent Primary Care Visit:   Medication: celecoxib (CELEBREX) 200 MG capsule - Take 2 capsules by mouth daily with food. Insurance will pay for 200 MG  Has the patient contacted their pharmacy? Yes (Agent: If no, request that the patient contact the pharmacy for the refill. If patient does not wish to contact the pharmacy document the reason why and proceed with request.) (Agent: If yes, when and what did the pharmacy advise?) Pharmacy needs a order to refill  Is this the correct pharmacy for this prescription? Yes If no, delete pharmacy and type the correct one.  This is the patient's preferred pharmacy:  [facility] [address] [place] - [address] [place] [address] [place] [address] Phone: [phone] Fax: [phone]   Has the prescription been filled recently? No  Is the patient out of the medication? Yes - He was out of medication 2 days ago   Has the patient been seen for an appointment in the last year OR does the patient have an upcoming appointment? Yes  Can we respond through MyChart? Yes  Agent: Please be advised that Rx refills may take up to 3 business days. We ask that you follow-up with your pharmacy.

## 2023-10-12 NOTE — Telephone Encounter (Signed)
 Numerous existing refills for celebrex.

## 2023-11-20 ENCOUNTER — Other Ambulatory Visit: Payer: Self-pay | Admitting: Family Medicine

## 2023-11-20 ENCOUNTER — Ambulatory Visit: Payer: Self-pay

## 2023-11-20 NOTE — Telephone Encounter (Signed)
 Celebrex  is pended here for refill.

## 2023-11-20 NOTE — Telephone Encounter (Unsigned)
 Copied from CRM [id number]. Topic: Clinical - Medication Refill >> Nov 20, 2023  4:51 PM [name] wrote: Medication: celecoxib  (CELEBREX ) 200 MG capsule   Has the patient contacted their pharmacy? No (Agent: If no, request that the patient contact the pharmacy for the refill. If patient does not wish to contact the pharmacy document the reason why and proceed with request.) (Agent: If yes, when and what did the pharmacy advise?)  This is the patient's preferred pharmacy:  [id number] - [place], [place] - [address] [place] [address] Phone: [phone] Fax: [phone]  Is this the correct pharmacy for this prescription? Yes If no, delete pharmacy and type the correct one.   Has the prescription been filled recently? Yes  Is the patient out of the medication? Yes  Has the patient been seen for an appointment in the last year OR does the patient have an upcoming appointment? Yes  Can we respond through MyChart? Yes  Agent: Please be advised that Rx refills may take up to 3 business days. We ask that you follow-up with your pharmacy.

## 2023-11-20 NOTE — Telephone Encounter (Signed)
 Chief Complaint: knee pain Symptoms: pain, swelling Frequency: chronic knee pain Pertinent Negatives: Patient denies redness, inability to walk, CP, SOB, palpitations, dizziness Disposition: [] ED /[x] Urgent Care (no appt availability in office) / [] Appointment(In office/virtual)/ []  [facility]/ [] Home Care/ [x] Refused Recommended Disposition /[] [facility]/ []  Follow-up with PCP Additional Notes: Pt reports bilateral knee pain and swelling. 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 Pt states his Celebrex  dose was doubled and the prescription needs to be changed. Pt states he has run out of the medication and is requesting it be refilled before the long weekend. Pt reports 8/10 pain. Pt endorses some pain and swelling in his calves as well. Pt denies CP, SOB, palpitations, dizziness, fever, redness. RN advised the pt he should be seen within 24 hours for his symptoms and advised he seek care at an UC local to the area in which he is working. Pt declined. Pt states he is willing to come into the office Friday this week after he gets home on Thursday. There is no availability with anyone in the office on Friday and no availability next week until Tuesday. RN advised pt RN would relay refill request to the office and alert the office that the pt needs an appt, since he is unlikely to go to an UC. RN advised pt should he develop CP, SOB, or a racing heart he needs to go to the ED. Pt verbalized understanding.    Copied from CRM [id number]. Topic: Clinical - Red Word Triage >> Nov 20, 2023  4:49 PM Chrystal Crape R wrote: Pt is out of his celecoxib  (CELEBREX ) 200 MG capsule and states his knees are really hurting bad and he's been out for about three days. Reason for Disposition  [1] Very swollen joint AND [2] no fever  Answer Assessment - Initial Assessment Questions 1. LOCATION and RADIATION: "Where is the pain located?"      Knee  2. QUALITY:  "What does the pain feel like?"  (e.g., sharp, dull, aching, burning)     Sharp  3. SEVERITY: "How bad is the pain?" "What does it keep you from doing?"   (Scale 1-10; or mild, moderate, severe)   -  MILD (1-3): doesn't interfere with normal activities    -  MODERATE (4-7): interferes with normal activities (e.g., work or school) or awakens from sleep, limping    -  SEVERE (8-10): excruciating pain, unable to do any normal activities, unable to walk     8/10  4. ONSET: "When did the pain start?" "Does it come and go, or is it there all the time?"     Chronic pain, states he ran out of Celebrex ; currently working in [employer]  5. RECURRENT: "Have you had this pain before?" If Yes, ask: "When, and what happened then?"     yes 6. SETTING: "Has there been any recent work, exercise or other activity that involved that part of the body?"      Works Holiday representative 7. AGGRAVATING FACTORS: "What makes the knee pain worse?" (e.g., walking, climbing stairs, running)     Climbing stairs  8. ASSOCIATED SYMPTOMS: "Is there any swelling or redness of the knee?"     swelling  9. OTHER SYMPTOMS: "Do you have any other symptoms?" (e.g., chest pain, difficulty breathing, fever, calf pain)     States he has run out of Celebrex  since his dosage was doubled. Can walk but is unable to climb  stairs. Endorses bilateral knee swelling. Knee he got a shot in the office at the office is worse right now. Denies redness. Endorses "a little" calf pain and swelling. States he has not been diagnosed with a DVT but states the blood vessels in his legs turned blue a few years ago and then it went away. Denies CP and SOB. Denies heart palpitations.  Protocols used: Knee Pain-A-AH

## 2023-11-21 NOTE — Telephone Encounter (Signed)
 TC back to pt, this was refilled in March for 90-d supply with 3 refills, to please check with [facility] these should be on file.

## 2023-11-21 NOTE — Telephone Encounter (Signed)
 Contacted pharmacy. Their were two scripts on file causing a conflict. I clarified #180 take 200 mg BID. Pharmacy tried to run 90 day supply. Unable to run. She was able to run a one month supply #60. I contacted patient and notified.

## 2023-12-05 ENCOUNTER — Other Ambulatory Visit: Payer: Self-pay | Admitting: *Deleted

## 2023-12-05 MED ORDER — VITAMIN D (ERGOCALCIFEROL) 1.25 MG (50000 UNIT) PO CAPS: 50000.0000 [IU] | ORAL_CAPSULE | ORAL | 1 refills | Status: DC

## 2023-12-19 ENCOUNTER — Ambulatory Visit: Payer: 59 | Admitting: Family Medicine

## 2023-12-20 ENCOUNTER — Other Ambulatory Visit: Payer: Self-pay | Admitting: Family Medicine

## 2023-12-24 ENCOUNTER — Other Ambulatory Visit (HOSPITAL_COMMUNITY): Payer: Self-pay

## 2023-12-24 ENCOUNTER — Encounter: Payer: Self-pay | Admitting: Family Medicine

## 2023-12-24 ENCOUNTER — Ambulatory Visit (INDEPENDENT_AMBULATORY_CARE_PROVIDER_SITE_OTHER): Admitting: Family Medicine

## 2023-12-24 ENCOUNTER — Telehealth: Payer: Self-pay

## 2023-12-24 DIAGNOSIS — M25561 Pain in right knee: Secondary | ICD-10-CM

## 2023-12-24 DIAGNOSIS — J329 Chronic sinusitis, unspecified: Secondary | ICD-10-CM

## 2023-12-24 DIAGNOSIS — I10 Essential (primary) hypertension: Secondary | ICD-10-CM

## 2023-12-24 DIAGNOSIS — E291 Testicular hypofunction: Secondary | ICD-10-CM

## 2023-12-24 DIAGNOSIS — G8929 Other chronic pain: Secondary | ICD-10-CM

## 2023-12-24 DIAGNOSIS — R5382 Chronic fatigue, unspecified: Secondary | ICD-10-CM | POA: Diagnosis not present

## 2023-12-24 DIAGNOSIS — E559 Vitamin D deficiency, unspecified: Secondary | ICD-10-CM | POA: Diagnosis not present

## 2023-12-24 MED ORDER — FEXOFENADINE HCL 180 MG PO TABS: 180.0000 mg | ORAL_TABLET | Freq: Every day | ORAL | 11 refills | Status: AC

## 2023-12-24 MED ORDER — RYBELSUS 7 MG PO TABS: 7.0000 mg | ORAL_TABLET | Freq: Every day | ORAL | 2 refills | Status: DC

## 2023-12-24 MED ORDER — TADALAFIL 20 MG PO TABS: 20.0000 mg | ORAL_TABLET | Freq: Every day | ORAL | 5 refills | Status: AC | PRN

## 2023-12-24 MED ORDER — CELECOXIB 400 MG PO CAPS: 400.0000 mg | ORAL_CAPSULE | Freq: Every day | ORAL | 1 refills | Status: DC

## 2023-12-24 MED ORDER — VALSARTAN 160 MG PO TABS: 160.0000 mg | ORAL_TABLET | Freq: Every day | ORAL | 2 refills | Status: AC

## 2023-12-24 NOTE — Telephone Encounter (Signed)
 A user error has taken place: charting done on wrong patient and has been corrected.

## 2023-12-24 NOTE — Telephone Encounter (Deleted)
 Pharmacy Patient Advocate Encounter   Received notification from CoverMyMeds that prior authorization for Celecoxib  400MG  capsules  is required/requested.   Insurance verification completed.   The patient is insured through [facility] . [employer] [place]   Per test claim: CELECOXIB  400 MG IS EXCLUDED FROM PLAN(NON-FORMULARY DRUG). CELECOXIB  200 MG(400 MG) CAPSULES DAILY IS COVERED AT COPAY OF $0.00. IF YOU WANT TO PROCEED WITH PA LET US  KNOW. BUT IF NOT SCRIPT CAN BE CHANGE AND SENT TO PHARMACY.

## 2023-12-24 NOTE — Progress Notes (Signed)
 Subjective:  Patient ID: Jimmy Levine, male    DOB: Oct 09, 1960  Age: 63 y.o. MRN: [id number]  CC: Medical Management of Chronic Issues (Still having ongoing swelling in ankles and legs. Some discoloration. ) and Knee Pain (Still having knee pain. Trouble going up stairs. Celebrex  does help but not when climbing anything. )   HPI Jimmy Levine presents for knee pain. Celebrex  helps. With the pain, but not sufficient. Just recently was able to get the proper prescription. He has not been able to get 60 of the 200 mg pillls until this month. Energy is low. Has swelling in the legs, but not dyspneic.    presents for  follow-up of hypertension. Patient has no history of headache chest pain or shortness of breath or recent cough. Patient also denies symptoms of TIA such as focal numbness or weakness. Patient denies side effects from medication. States taking it regularly.   in for follow-up of elevated cholesterol. Doing well without complaints on current medication. Denies side effects of statin including myalgia and arthralgia and nausea. Currently no chest pain, shortness of breath or other cardiovascular related symptoms noted.  Taking testosterone  supplement. Libido nml, but having some. E.D.   Has not been able to lose weight in spite of multiple diet attempts.     12/24/2023   11:16 AM 09/17/2023   10:41 AM 09/17/2023   10:39 AM  Depression screen PHQ 2/9  Decreased Interest 0 0   Down, Depressed, Hopeless 0 0 0  PHQ - 2 Score 0 0 0  Altered sleeping 0 1 1  Tired, decreased energy 1 1 1   Change in appetite 1 1 1   Feeling bad or failure about yourself  0 0 0  Trouble concentrating 0 0 0  Moving slowly or fidgety/restless 0 0 0  Suicidal thoughts 0 0 0  PHQ-9 Score 2 3 3   Difficult doing work/chores Not difficult at all Not difficult at all Not difficult at all    History Wong has a past medical history of Arthritis, Carpal tunnel syndrome, GERD (gastroesophageal  reflux disease), Kidney stone, and Sleep apnea.   He has a past surgical history that includes Hernia repair and Hand Tenolysis.   His family history includes Arthritis in his brother, father, and mother; COPD in his brother and mother; Heart attack (age of onset: 70) in his father; Hypertension in his mother.He reports that he has never smoked. He has never used smokeless tobacco. He reports that he does not currently use alcohol. He reports that he does not use drugs.    ROS Review of Systems  Constitutional:  Negative for fever.  Respiratory:  Negative for shortness of breath.   Cardiovascular:  Negative for chest pain.  Musculoskeletal:  Negative for arthralgias.  Skin:  Negative for rash.    Objective:  BP 127/69   Pulse 70   Temp 97.8 F (36.6 C)   Wt (!) 330 lb (149.7 kg)   SpO2 94%   BMI 402.80 kg/m   BP Readings from Last 3 Encounters:  12/24/23 127/69  09/17/23 (!) 145/82  06/20/23 (!) 159/100    Wt Readings from Last 3 Encounters:  12/24/23 (!) 330 lb (149.7 kg)  09/17/23 (!) 325 lb (147.4 kg)  06/20/23 (!) 323 lb 6.4 oz (146.7 kg)     Physical Exam Vitals reviewed.  Constitutional:      Appearance: He is well-developed.  HENT:     Head: Normocephalic and atraumatic.  Right Ear: External ear normal.     Left Ear: External ear normal.     Mouth/Throat:     Pharynx: No oropharyngeal exudate or posterior oropharyngeal erythema.   Eyes:     Pupils: Pupils are equal, round, and reactive to light.    Cardiovascular:     Rate and Rhythm: Normal rate and regular rhythm.     Heart sounds: No murmur heard. Pulmonary:     Effort: No respiratory distress.     Breath sounds: Normal breath sounds.   Musculoskeletal:     Cervical back: Normal range of motion and neck supple.   Neurological:     Mental Status: He is alert and oriented to person, place, and time.      Assessment & Plan:  Morbid obesity (HCC)  Chronic sinusitis, unspecified  location -     Fexofenadine  HCl; Take 1 tablet (180 mg total) by mouth daily. For allergy symptoms  Dispense: 30 tablet; Refill: 11  Chronic pain of right knee  Essential hypertension -     CBC with Differential/Platelet -     CMP14+EGFR -     Testosterone ,Free and Total -     TSH -     Lipid panel  Hypogonadism in male -     Testosterone ,Free and Total -     TSH  Vitamin D  deficiency  Chronic fatigue -     Testosterone ,Free and Total -     TSH  Other orders -     Tadalafil ; Take 1 tablet (20 mg total) by mouth daily as needed for erectile dysfunction.  Dispense: 12 tablet; Refill: 5 -     Celecoxib ; Take 1 capsule (400 mg total) by mouth daily. With food  Dispense: 90 capsule; Refill: 1 -     Rybelsus; Take 1 tablet (7 mg total) by mouth daily.  Dispense: 30 tablet; Refill: 2 -     Valsartan ; Take 1 tablet (160 mg total) by mouth daily.  Dispense: 90 tablet; Refill: 2     Follow-up: Return in about 6 weeks (around 02/04/2024), or if symptoms worsen or fail to improve.  Butler Der, M.D.

## 2023-12-24 NOTE — Telephone Encounter (Signed)
 Pharmacy Patient Advocate Encounter   Received notification from CoverMyMeds that prior authorization for Rybelsus 7MG  tablets  is required/requested.   Insurance verification completed.   The patient is insured through [facility] .   Prior Authorization form/request asks a question that requires your assistance. Please see the question below and advise accordingly. The PA will not be submitted until the necessary information is received. WHAT IS DIAGNOSIS?

## 2023-12-25 ENCOUNTER — Other Ambulatory Visit (HOSPITAL_COMMUNITY): Payer: Self-pay

## 2023-12-25 ENCOUNTER — Other Ambulatory Visit

## 2023-12-25 ENCOUNTER — Telehealth: Payer: Self-pay | Admitting: Pharmacy Technician

## 2023-12-25 DIAGNOSIS — I10 Essential (primary) hypertension: Secondary | ICD-10-CM | POA: Diagnosis not present

## 2023-12-25 DIAGNOSIS — E291 Testicular hypofunction: Secondary | ICD-10-CM | POA: Diagnosis not present

## 2023-12-25 DIAGNOSIS — R5382 Chronic fatigue, unspecified: Secondary | ICD-10-CM | POA: Diagnosis not present

## 2023-12-25 LAB — LIPID PANEL

## 2023-12-25 NOTE — Telephone Encounter (Signed)
 E66.01 is ICD 10 code for morbid obesity for use with prior auth for Rybelsus

## 2023-12-25 NOTE — Telephone Encounter (Signed)
 Ozempic/Mounjaro/RYBELSUS is approved exclusively as an adjunct to diet and exercise to improve glycemic  control in adults with TYPE 2 DIABETES MELLITUS. A review of patient's medical chart reveals no  DOCUMENTED DIAGNOSIS OF TYPE 2 DIABETES OR AN A1C INDICATIVE OF DIABETES. Therefore, they DO NOT  currently meet the criteria for prior authorization of this medication. If clinically appropriate, alternative  options such as Saxenda, Zepbound, or Georjean may be considered for this patient.

## 2023-12-25 NOTE — Telephone Encounter (Signed)
 Pharmacy Patient Advocate Encounter   Received notification from Onbase that prior authorization for Celecoxib  400MG  capsules is required/requested.   Insurance verification completed.   The patient is insured through [facility] .   Per test claim:  CELECOXIB  200MG  CAPSULES is preferred by the insurance.  If suggested medication is appropriate, Please send in a new RX and discontinue this one. If not, please advise as to why it's not appropriate so that we may request a Prior Authorization. Please note, some preferred medications may still require a PA.  If the suggested medications have not been trialed and there are no contraindications to their use, the PA will not be submitted, as it will not be approved.  Can order be changed to 200mg  capsules. Take 2 capsules by mouth once daily with food? Covered with $0.00 copay.

## 2023-12-26 ENCOUNTER — Other Ambulatory Visit: Payer: Self-pay | Admitting: Family Medicine

## 2023-12-26 LAB — CBC WITH DIFFERENTIAL/PLATELET
Basophils Absolute: 0.1 10*3/uL (ref 0.0–0.2)
Basos: 1 %
EOS (ABSOLUTE): 0.1 10*3/uL (ref 0.0–0.4)
Eos: 1 %
Hematocrit: 48 % (ref 37.5–51.0)
Hemoglobin: 15.7 g/dL (ref 13.0–17.7)
Immature Grans (Abs): 0 10*3/uL (ref 0.0–0.1)
Immature Granulocytes: 0 %
Lymphocytes Absolute: 1.5 10*3/uL (ref 0.7–3.1)
Lymphs: 27 %
MCH: 31.3 pg (ref 26.6–33.0)
MCHC: 32.7 g/dL (ref 31.5–35.7)
MCV: 96 fL (ref 79–97)
Monocytes Absolute: 0.6 10*3/uL (ref 0.1–0.9)
Monocytes: 11 %
Neutrophils Absolute: 3.2 10*3/uL (ref 1.4–7.0)
Neutrophils: 60 %
Platelets: 210 10*3/uL (ref 150–450)
RBC: 5.01 x10E6/uL (ref 4.14–5.80)
RDW: 12.7 % (ref 11.6–15.4)
WBC: 5.5 10*3/uL (ref 3.4–10.8)

## 2023-12-26 LAB — LIPID PANEL
Cholesterol, Total: 165 mg/dL (ref 100–199)
HDL: 38 mg/dL — ABNORMAL LOW (ref >39)
LDL CALC COMMENT:: 4.3 ratio (ref 0.0–5.0)
LDL Chol Calc (NIH): 107 mg/dL — ABNORMAL HIGH (ref 0–99)
Triglycerides: 109 mg/dL (ref 0–149)
VLDL Cholesterol Cal: 20 mg/dL (ref 5–40)

## 2023-12-26 LAB — CMP14+EGFR
ALT: 19 IU/L (ref 0–44)
AST: 18 IU/L (ref 0–40)
Albumin: 4.3 g/dL (ref 3.9–4.9)
Alkaline Phosphatase: 60 IU/L (ref 44–121)
BUN/Creatinine Ratio: 15 (ref 10–24)
BUN: 15 mg/dL (ref 8–27)
Bilirubin Total: 0.6 mg/dL (ref 0.0–1.2)
CO2: 25 mmol/L (ref 20–29)
Calcium: 9.2 mg/dL (ref 8.6–10.2)
Chloride: 101 mmol/L (ref 96–106)
Creatinine, Ser: 1.01 mg/dL (ref 0.76–1.27)
Globulin, Total: 2.3 g/dL (ref 1.5–4.5)
Glucose: 86 mg/dL (ref 70–99)
Potassium: 4.5 mmol/L (ref 3.5–5.2)
Sodium: 142 mmol/L (ref 134–144)
Total Protein: 6.6 g/dL (ref 6.0–8.5)
eGFR: 83 mL/min/{1.73_m2} (ref >59)

## 2023-12-26 LAB — TSH: TSH: 2.2 u[IU]/mL (ref 0.450–4.500)

## 2023-12-26 LAB — TESTOSTERONE,FREE AND TOTAL
Testosterone, Free: 18.3 pg/mL — ABNORMAL HIGH (ref 6.6–18.1)
Testosterone: 542 ng/dL (ref 264–916)

## 2023-12-26 MED ORDER — CELECOXIB 200 MG PO CAPS: 400.0000 mg | ORAL_CAPSULE | Freq: Every day | ORAL | 3 refills | Status: DC

## 2023-12-26 NOTE — Telephone Encounter (Signed)
 Know the Rybelsus was denied. If he will reconsider the shot, which is very minimal, (most people say they don't even feel it) we can try to get that approved for him.

## 2023-12-26 NOTE — Telephone Encounter (Signed)
 Patient aware and states he would like shot.

## 2023-12-27 ENCOUNTER — Ambulatory Visit: Payer: Self-pay | Admitting: Family Medicine

## 2023-12-27 ENCOUNTER — Other Ambulatory Visit: Payer: Self-pay | Admitting: Family Medicine

## 2023-12-27 MED ORDER — WEGOVY 0.25 MG/0.5ML ~~LOC~~ SOAJ: 0.2500 mg | SUBCUTANEOUS | 3 refills | Status: DC

## 2023-12-27 NOTE — Telephone Encounter (Signed)
 Patient aware and verbalizes understanding.

## 2023-12-27 NOTE — Progress Notes (Signed)
 Hello Freeland,  Your lab result is normal and/or stable.Some minor variations that are not significant are commonly marked abnormal, but do not represent any medical problem for you.  Best regards, Mechele Claude, M.D.

## 2023-12-27 NOTE — Telephone Encounter (Signed)
 Therapy changed to 200mg  capsules.

## 2023-12-27 NOTE — Telephone Encounter (Signed)
 I sent in wegovy. Hopefully we can get it approved.

## 2023-12-28 ENCOUNTER — Other Ambulatory Visit (HOSPITAL_COMMUNITY): Payer: Self-pay

## 2023-12-28 ENCOUNTER — Telehealth: Payer: Self-pay

## 2023-12-28 NOTE — Telephone Encounter (Signed)
 Pt made aware.  Are there any other medications options?

## 2023-12-28 NOTE — Telephone Encounter (Signed)
 Pharmacy Patient Advocate Encounter   Received notification from CoverMyMeds that prior authorization for Wegovy  0.25 is required/requested.   Insurance verification completed.   The patient is insured through [employer] .   Per test claim: patient has plan benefit exclusion. Medication is not covered.

## 2024-01-14 ENCOUNTER — Other Ambulatory Visit (HOSPITAL_COMMUNITY): Payer: Self-pay

## 2024-01-14 ENCOUNTER — Ambulatory Visit: Payer: Self-pay

## 2024-01-14 NOTE — Telephone Encounter (Signed)
 Patient scheduled 07/16 with Dr. Zollie 8:05am

## 2024-01-14 NOTE — Telephone Encounter (Signed)
Pt. Needs to be seen for this. Thanks, WS 

## 2024-01-14 NOTE — Telephone Encounter (Signed)
 FYI Only or Action Required?: Action required by provider: clinical question for provider.  Patient was last seen in primary care on 12/24/2023 by Zollie Lowers, MD.  Called Nurse Triage reporting Diarrhea.  Symptoms began yesterday.  Interventions attempted: Nothing.  Symptoms are: gradually improving.  Triage Disposition: See Physician Within 24 Hours  Patient/caregiver understands and will follow disposition?: No, wishes to speak with PCP- decline scheduling. Asking if he can just be treated with an abx without an appt. Will send request over. This RN advised patient that a appt may be needed. Will need on if safe to take with other medications    Copied from CRM [id number]. Topic: Clinical - Red Word Triage >> Jan 14, 2024 12:27 PM Delon HERO wrote: Red Word that prompted transfer to Nurse Triage: Patient is calling to report report his wife has c-dff and he got his with diarrhea. With recatal pain. Patient requestin the same script as his wife Vancomycin  hydochloride Reason for Disposition  [1] SEVERE diarrhea (e.g., 7 or more times / day more than normal) AND [2] present > 24 hours (1 day)  Answer Assessment - Initial Assessment Questions 1. DIARRHEA SEVERITY: How bad is the diarrhea? How many more stools have you had in the past 24 hours than normal?      10-12 times yesterday  2. ONSET: When did the diarrhea begin?      Started yesterday  3. STOOL DESCRIPTION:  How loose or watery is the diarrhea? What is the stool color? Is there any blood or mucous in the stool?     Watery and loose, no blood or mucous  4. VOMITING: Are you also vomiting? If Yes, ask: How many times in the past 24 hours?      No  5. ABDOMEN PAIN: Are you having any abdomen pain? If Yes, ask: What does it feel like? (e.g., crampy, dull, intermittent, constant)      No  6. ABDOMEN PAIN SEVERITY: If present, ask: How bad is the pain?  (e.g., Scale 1-10; mild, moderate, or severe)      No  7. ORAL INTAKE: If vomiting, Have you been able to drink liquids? How much liquids have you had in the past 24 hours?     N/a  8. HYDRATION: Any signs of dehydration? (e.g., dry mouth [not just dry lips], too weak to stand, dizziness, new weight loss) When did you last urinate?     No  9. EXPOSURE: Have you traveled to a foreign country recently? Have you been exposed to anyone with diarrhea? Could you have eaten any food that was spoiled?     Exposed to wife with C-Diff  10. ANTIBIOTIC USE: Are you taking antibiotics now or have you taken antibiotics in the past 2 months?       No  11. OTHER SYMPTOMS: Do you have any other symptoms? (e.g., fever, blood in stool)       No  Protocols used: Diarrhea-A-AH

## 2024-01-16 ENCOUNTER — Ambulatory Visit (INDEPENDENT_AMBULATORY_CARE_PROVIDER_SITE_OTHER): Admitting: Family Medicine

## 2024-01-16 ENCOUNTER — Encounter: Payer: Self-pay | Admitting: Family Medicine

## 2024-01-16 VITALS — BP 139/82 | HR 93 | Temp 98.3°F | Wt 325.0 lb

## 2024-01-16 DIAGNOSIS — A09 Infectious gastroenteritis and colitis, unspecified: Secondary | ICD-10-CM | POA: Diagnosis not present

## 2024-01-16 LAB — CBC WITH DIFFERENTIAL/PLATELET
Basophils Absolute: 0.1 x10E3/uL (ref 0.0–0.2)
Basos: 1 %
EOS (ABSOLUTE): 0.1 x10E3/uL (ref 0.0–0.4)
Eos: 1 %
Hematocrit: 49 % (ref 37.5–51.0)
Hemoglobin: 16.1 g/dL (ref 13.0–17.7)
Immature Grans (Abs): 0 x10E3/uL (ref 0.0–0.1)
Immature Granulocytes: 0 %
Lymphocytes Absolute: 1.4 x10E3/uL (ref 0.7–3.1)
Lymphs: 27 %
MCH: 30.9 pg (ref 26.6–33.0)
MCHC: 32.9 g/dL (ref 31.5–35.7)
MCV: 94 fL (ref 79–97)
Monocytes Absolute: 0.6 x10E3/uL (ref 0.1–0.9)
Monocytes: 11 %
Neutrophils Absolute: 3.1 x10E3/uL (ref 1.4–7.0)
Neutrophils: 59 %
Platelets: 209 x10E3/uL (ref 150–450)
RBC: 5.21 x10E6/uL (ref 4.14–5.80)
RDW: 12.7 % (ref 11.6–15.4)
WBC: 5.3 x10E3/uL (ref 3.4–10.8)

## 2024-01-16 LAB — CMP14+EGFR
ALT: 30 IU/L (ref 0–44)
AST: 20 IU/L (ref 0–40)
Albumin: 4.1 g/dL (ref 3.9–4.9)
Alkaline Phosphatase: 58 IU/L (ref 44–121)
BUN/Creatinine Ratio: 10 (ref 10–24)
BUN: 11 mg/dL (ref 8–27)
Bilirubin Total: 0.6 mg/dL (ref 0.0–1.2)
CO2: 23 mmol/L (ref 20–29)
Calcium: 9.3 mg/dL (ref 8.6–10.2)
Chloride: 103 mmol/L (ref 96–106)
Creatinine, Ser: 1.05 mg/dL (ref 0.76–1.27)
Globulin, Total: 2.1 g/dL (ref 1.5–4.5)
Glucose: 89 mg/dL (ref 70–99)
Potassium: 4.4 mmol/L (ref 3.5–5.2)
Sodium: 142 mmol/L (ref 134–144)
Total Protein: 6.2 g/dL (ref 6.0–8.5)
eGFR: 79 mL/min/1.73 (ref >59)

## 2024-01-16 MED ORDER — VANCOMYCIN HCL 125 MG PO CAPS: 125.0000 mg | ORAL_CAPSULE | Freq: Four times a day (QID) | ORAL | 0 refills | Status: AC

## 2024-01-16 NOTE — Progress Notes (Signed)
 Subjective:  Patient ID: Jimmy Levine, male    DOB: 23-Jun-1961  Age: 64 y.o. MRN: [id number]  CC: Diarrhea (Diarrhea since Saturday. No stomach pain. No blood in stool that he has noticed. Wife has Cdiff and  taking vancocymin. )   HPI Jimmy Levine presents for diarrhea. Onset 7/12 with watery BM X 12. No pain. Wife has C.0dif. Just dx with terminal Ca . Denies abd painand rectal pain.      12/24/2023   11:16 AM 09/17/2023   10:41 AM 09/17/2023   10:39 AM  Depression screen PHQ 2/9  Decreased Interest 0 0   Down, Depressed, Hopeless 0 0 0  PHQ - 2 Score 0 0 0  Altered sleeping 0 1 1  Tired, decreased energy 1 1 1   Change in appetite 1 1 1   Feeling bad or failure about yourself  0 0 0  Trouble concentrating 0 0 0  Moving slowly or fidgety/restless 0 0 0  Suicidal thoughts 0 0 0  PHQ-9 Score 2 3 3   Difficult doing work/chores Not difficult at all Not difficult at all Not difficult at all    History Wissam has a past medical history of Arthritis, Carpal tunnel syndrome, GERD (gastroesophageal reflux disease), Kidney stone, and Sleep apnea.   He has a past surgical history that includes Hernia repair and Hand Tenolysis.   His family history includes Arthritis in his brother, father, and mother; COPD in his brother and mother; Heart attack (age of onset: 33) in his father; Hypertension in his mother.He reports that he has never smoked. He has never used smokeless tobacco. He reports that he does not currently use alcohol. He reports that he does not use drugs.    ROS Review of Systems  Constitutional: Negative.  Negative for chills, diaphoresis and fever.  HENT: Negative.    Eyes:  Negative for visual disturbance.  Respiratory:  Negative for cough and shortness of breath.   Cardiovascular:  Negative for chest pain and leg swelling.  Gastrointestinal:  Positive for diarrhea. Negative for abdominal pain, anal bleeding, blood in stool, nausea, rectal pain and  vomiting.  Genitourinary:  Negative for difficulty urinating.  Musculoskeletal:  Negative for arthralgias and myalgias.  Skin:  Negative for rash.  Neurological:  Negative for headaches.  Psychiatric/Behavioral:  Negative for sleep disturbance.     Objective:  BP 139/82   Pulse 93   Temp 98.3 F (36.8 C)   Wt (!) 325 lb (147.4 kg)   SpO2 93%   BMI 396.70 kg/m   BP Readings from Last 3 Encounters:  01/16/24 139/82  12/24/23 127/69  09/17/23 (!) 145/82    Wt Readings from Last 3 Encounters:  01/16/24 (!) 325 lb (147.4 kg)  12/24/23 (!) 330 lb (149.7 kg)  09/17/23 (!) 325 lb (147.4 kg)     Physical Exam Vitals reviewed.  Constitutional:      Appearance: He is well-developed.  HENT:     Head: Normocephalic and atraumatic.     Right Ear: External ear normal.     Left Ear: External ear normal.     Mouth/Throat:     Pharynx: No oropharyngeal exudate or posterior oropharyngeal erythema.  Eyes:     Pupils: Pupils are equal, round, and reactive to light.  Cardiovascular:     Rate and Rhythm: Normal rate and regular rhythm.     Heart sounds: No murmur heard. Pulmonary:     Effort: No respiratory distress.  Breath sounds: Normal breath sounds.  Abdominal:     General: There is distension.     Palpations: There is no mass.     Tenderness: There is no abdominal tenderness. There is no right CVA tenderness, guarding or rebound.  Musculoskeletal:     Cervical back: Normal range of motion and neck supple.  Neurological:     Mental Status: He is alert and oriented to person, place, and time.      Assessment & Plan:  Diarrhea of infectious origin -     Vancomycin  HCl; Take 1 capsule (125 mg total) by mouth 4 (four) times daily for 10 days.  Dispense: 40 capsule; Refill: 0 -     CBC with Differential/Platelet -     CMP14+EGFR -     Clostridium difficile EIA; Future     Follow-up: Return if symptoms worsen or fail to improve.  Butler Der, M.D.

## 2024-01-17 ENCOUNTER — Ambulatory Visit: Payer: Self-pay | Admitting: Family Medicine

## 2024-01-17 ENCOUNTER — Other Ambulatory Visit

## 2024-01-17 DIAGNOSIS — A09 Infectious gastroenteritis and colitis, unspecified: Secondary | ICD-10-CM

## 2024-01-17 NOTE — Progress Notes (Signed)
 Hello Freeland,  Your lab result is normal and/or stable.Some minor variations that are not significant are commonly marked abnormal, but do not represent any medical problem for you.  Best regards, Mechele Claude, M.D.

## 2024-01-19 LAB — CLOSTRIDIUM DIFFICILE EIA: C difficile Toxins A+B, EIA: NEGATIVE

## 2024-01-20 ENCOUNTER — Encounter: Payer: Self-pay | Admitting: Family Medicine

## 2024-02-04 ENCOUNTER — Encounter: Payer: Self-pay | Admitting: Family Medicine

## 2024-02-04 ENCOUNTER — Ambulatory Visit (INDEPENDENT_AMBULATORY_CARE_PROVIDER_SITE_OTHER): Admitting: Family Medicine

## 2024-02-04 VITALS — BP 158/94 | HR 78 | Temp 97.8°F | Ht 69.0 in | Wt 332.2 lb

## 2024-02-04 DIAGNOSIS — A09 Infectious gastroenteritis and colitis, unspecified: Secondary | ICD-10-CM

## 2024-02-04 DIAGNOSIS — E291 Testicular hypofunction: Secondary | ICD-10-CM

## 2024-02-04 MED ORDER — TESTOSTERONE 20.25 MG/ACT (1.62%) TD GEL: 4.0000 | Freq: Every day | TRANSDERMAL | Status: AC

## 2024-02-04 NOTE — Progress Notes (Signed)
 Subjective:  Patient ID: Jimmy Levine, male    DOB: [birth year]  Age: 63 y.o. MRN: [id number]  CC: Follow-up   HPI Jimmy Levine presents for resolution of the diarrhea.  It only took a day or 2 after treatment was started a couple of weeks ago.  Abdomen is essentially back to normal. Follow up for testosterone  deficiency: Pt. Using medication as directed. Denies any sx referrable to DVT such as edema or erythema of legs. No dyspnea or chest pain. Energy level reported as being good. Libido is normal and denies E.D. Feels strength is adequate and improved from baseline.      02/04/2024    3:42 PM 02/04/2024    3:30 PM 12/24/2023   11:16 AM  Depression screen PHQ 2/9  Decreased Interest 1 0 0  Down, Depressed, Hopeless 1 0 0  PHQ - 2 Score 2 0 0  Altered sleeping 1  0  Tired, decreased energy 0  1  Change in appetite 1  1  Feeling bad or failure about yourself  0  0  Trouble concentrating 0  0  Moving slowly or fidgety/restless 0  0  Suicidal thoughts 0  0  PHQ-9 Score 4  2  Difficult doing work/chores Not difficult at all  Not difficult at all      02/04/2024    3:43 PM 12/24/2023   11:16 AM 09/17/2023   10:42 AM 09/17/2023   10:39 AM  GAD 7 : Generalized Anxiety Score  Nervous, Anxious, on Edge 0 0 0 0  Control/stop worrying 0 0 0 0  Worry too much - different things 1 0 0 0  Trouble relaxing 0 1 1 1   Restless 0 0 1 1  Easily annoyed or irritable 0 0 0 0  Afraid - awful might happen 1 0 0 0  Total GAD 7 Score 2 1 2 2   Anxiety Difficulty Somewhat difficult Not difficult at all Not difficult at all Not difficult at all     History Winifred has a past medical history of Arthritis, Carpal tunnel syndrome, GERD (gastroesophageal reflux disease), Kidney stone, and Sleep apnea.   He has a past surgical history that includes Hernia repair and Hand Tenolysis.   His family history includes Arthritis in his brother, father, and mother; COPD in his brother and mother;  Heart attack (age of onset: 26) in his father; Hypertension in his mother.He reports that he has never smoked. He has never used smokeless tobacco. He reports that he does not currently use alcohol. He reports that he does not use drugs.    ROS Review of Systems  Objective:  BP (!) 158/94   Pulse 78   Temp 97.8 F (36.6 C)   Ht 5' 9 (1.753 m)   Wt (!) 332 lb 3.2 oz (150.7 kg)   SpO2 94%   BMI 49.06 kg/m   BP Readings from Last 3 Encounters:  02/04/24 (!) 158/94  01/16/24 139/82  12/24/23 127/69    Wt Readings from Last 3 Encounters:  02/04/24 (!) 332 lb 3.2 oz (150.7 kg)  01/16/24 (!) 325 lb (147.4 kg)  12/24/23 (!) 330 lb (149.7 kg)     Physical Exam Constitutional:      General: He is not in acute distress.    Appearance: He is well-developed.  HENT:     Head: Normocephalic and atraumatic.     Right Ear: External ear normal.     Left Ear: External ear normal.  Nose: Nose normal.  Eyes:     Conjunctiva/sclera: Conjunctivae normal.     Pupils: Pupils are equal, round, and reactive to light.  Cardiovascular:     Rate and Rhythm: Normal rate and regular rhythm.     Heart sounds: Normal heart sounds. No murmur heard. Pulmonary:     Effort: Pulmonary effort is normal. No respiratory distress.     Breath sounds: Normal breath sounds. No wheezing or rales.  Abdominal:     General: Bowel sounds are normal. There is no distension.     Palpations: Abdomen is soft.     Tenderness: There is no abdominal tenderness.  Musculoskeletal:        General: Normal range of motion.     Cervical back: Normal range of motion and neck supple.  Skin:    General: Skin is warm and dry.  Neurological:     Mental Status: He is alert and oriented to person, place, and time.     Deep Tendon Reflexes: Reflexes are normal and symmetric.  Psychiatric:        Behavior: Behavior normal.        Thought Content: Thought content normal.        Judgment: Judgment normal.       Assessment & Plan:  Diarrhea of infectious origin  Hypogonadism in male  Other orders -     Testosterone ; Place 4 Pump onto the skin daily.     Follow-up: Return if symptoms worsen or fail to improve and as previously arranged for ongoing conditions routine care.  Butler Der, M.D.

## 2024-02-10 ENCOUNTER — Encounter: Payer: Self-pay | Admitting: Family Medicine

## 2024-02-14 ENCOUNTER — Ambulatory Visit: Payer: Self-pay

## 2024-02-14 ENCOUNTER — Other Ambulatory Visit: Payer: Self-pay | Admitting: Family Medicine

## 2024-02-14 NOTE — Telephone Encounter (Signed)
 FYI Only or Action Required?: Action required by provider: medication refill request.  Patient was last seen in primary care on 02/04/2024 by Zollie Lowers, MD.  Called Nurse Triage reporting Medication Refill.  Symptoms began today.  Interventions attempted: Prescription medications: Celebrex .  Symptoms are: unchanged.Pt. States [facility] does not have refills. This nurse called and spoke with Alyssa who reports they do have refills and will have it ready for pick up today. Attempted to reach pt. Back, unable to leave message.  Triage Disposition: Call Pharmacist Within 24 Hours  Patient/caregiver understands and will follow disposition?:    Copied from CRM #[id number]. Topic: Clinical - Red Word Triage >> Feb 14, 2024  1:57 PM Delon HERO wrote: Red Word that prompted transfer to Nurse Triage: Patient is calling to report swelling in the L & R legs. No pain. Patient wife is in ICU. Patient is calling regarding refill for celecoxib  (CELEBREX ) 200 MG capsule [[id number]]. Medication last sent 12/26/23 Quantity 180 90 day supply with 3 refills. Patient advised that he did go [facility] and they don't have the script. Please advise. Patient advised provider double script and messed it up. Answer Assessment - Initial Assessment Questions 1. DRUG NAME: What medicine do you need to have refilled?     Celebrex  2. REFILLS REMAINING: How many refills are remaining? Notes: The label on the medicine or pill bottle will show how many refills are remaining. If there are no refills remaining, then a renewal may be needed.     0 3. EXPIRATION DATE: What is the expiration date? Note: The label states when the prescription will expire, and thus can no longer be refilled.)     N/a 4. PRESCRIBER: Who prescribed it? Note: The prescribing doctor or group is responsible for refill approvals..     Dr. Zollie 5. PHARMACY: Have you contacted your pharmacy (drugstore)? Note: Some pharmacies will contact the doctor  (or NP/PA).      yes 6. SYMPTOMS: Do you have any symptoms?     Has been out of medicine x 4 days - legs are swollen. 7. PREGNANCY: Is there any chance that you are pregnant? When was your last menstrual period?     N/A  Protocols used: Medication Refill and Renewal Call-A-AH

## 2024-02-14 NOTE — Telephone Encounter (Unsigned)
 Copied from CRM [id number]. Topic: Clinical - Medication Refill >> Feb 14, 2024 12:16 PM Shardie S wrote: Medication:  celecoxib  (CELEBREX ) 200 MG capsule Has the patient contacted their pharmacy? Yes (Agent: If no, request that the patient contact the pharmacy for the refill. If patient does not wish to contact the pharmacy document the reason why and proceed with request.) (Agent: If yes, when and what did the pharmacy advise?)  This is the patient's preferred pharmacy:  [id number] - [place], [place] - [address] [place] [address] Phone: [phone] Fax: [phone]  Is this the correct pharmacy for this prescription? Yes If no, delete pharmacy and type the correct one.   Has the prescription been filled recently? No  Is the patient out of the medication? Yes  Has the patient been seen for an appointment in the last year OR does the patient have an upcoming appointment? Yes  Can we respond through MyChart? No  Agent: Please be advised that Rx refills may take up to 3 business days. We ask that you follow-up with your pharmacy.

## 2024-02-15 MED ORDER — CELECOXIB 200 MG PO CAPS: 400.0000 mg | ORAL_CAPSULE | Freq: Every day | ORAL | 3 refills | Status: AC

## 2024-02-28 ENCOUNTER — Other Ambulatory Visit: Payer: Self-pay | Admitting: Family Medicine

## 2024-02-28 NOTE — Telephone Encounter (Signed)
 Last OV 02/04/24. Last RF 12/06/23. Next OV no scheduled appt at this time

## 2024-03-24 ENCOUNTER — Other Ambulatory Visit: Payer: Self-pay | Admitting: Family Medicine

## 2024-03-24 NOTE — Telephone Encounter (Signed)
 Per 09/18/23 lab results Nurse, if at all possible, could you send in a prescription for the patient for vitamin D  50,000 units, 1 p.o. weekly #13 with 3 refills   I changed requested qty to #13 w/ 1 refill to make it to March

## 2024-06-09 NOTE — Progress Notes (Signed)
 Jimmy Levine                                          MRN: [id number]   06/09/2024   The [facility] Quality Team Specialist reviewed this patient medical record for the purposes of chart review for care gap closure. The following were reviewed: chart review for care gap closure-controlling blood pressure.    [facility] Quality Team

## 2024-06-16 ENCOUNTER — Other Ambulatory Visit: Payer: Self-pay | Admitting: *Deleted

## 2024-06-16 MED ORDER — VITAMIN D (ERGOCALCIFEROL) 1.25 MG (50000 UNIT) PO CAPS: 50000.0000 [IU] | ORAL_CAPSULE | ORAL | 1 refills | Status: AC

## 2024-08-07 NOTE — Progress Notes (Signed)
 Jimmy Levine                                          MRN: [id number]   08/07/2024   The [facility] Quality Team Specialist reviewed this patient medical record for the purposes of chart review for care gap closure. The following were reviewed: chart review for care gap closure-controlling blood pressure.    [facility] Quality Team
